# Patient Record
Sex: Male | Born: 1940
Health system: Southern US, Community
[De-identification: ages and names within clinical notes are randomized; demographics above are authoritative.]

## PROBLEM LIST (undated history)

## (undated) DIAGNOSIS — K635 Polyp of colon: Secondary | ICD-10-CM

## (undated) DIAGNOSIS — N400 Enlarged prostate without lower urinary tract symptoms: Secondary | ICD-10-CM

## (undated) DIAGNOSIS — D509 Iron deficiency anemia, unspecified: Secondary | ICD-10-CM

## (undated) HISTORY — PX: COLONOSCOPY: SHX174

## (undated) HISTORY — DX: Benign prostatic hyperplasia without lower urinary tract symptoms: N40.0

## (undated) HISTORY — PX: PROSTATE BIOPSY: SHX241

## (undated) HISTORY — DX: Iron deficiency anemia, unspecified: D50.9

## (undated) HISTORY — DX: Polyp of colon: K63.5

---

## 2002-01-21 ENCOUNTER — Encounter: Payer: Self-pay | Admitting: Internal Medicine

## 2002-01-21 ENCOUNTER — Encounter (INDEPENDENT_AMBULATORY_CARE_PROVIDER_SITE_OTHER): Payer: Self-pay | Admitting: *Deleted

## 2007-03-21 ENCOUNTER — Ambulatory Visit: Payer: Self-pay | Admitting: Internal Medicine

## 2007-04-04 ENCOUNTER — Ambulatory Visit: Payer: Self-pay | Admitting: Internal Medicine

## 2007-04-04 ENCOUNTER — Encounter: Payer: Self-pay | Admitting: Internal Medicine

## 2009-02-23 ENCOUNTER — Encounter (INDEPENDENT_AMBULATORY_CARE_PROVIDER_SITE_OTHER): Payer: Self-pay | Admitting: *Deleted

## 2009-03-30 ENCOUNTER — Ambulatory Visit: Payer: Self-pay | Admitting: Internal Medicine

## 2009-03-30 ENCOUNTER — Encounter (INDEPENDENT_AMBULATORY_CARE_PROVIDER_SITE_OTHER): Payer: Self-pay | Admitting: *Deleted

## 2009-03-30 DIAGNOSIS — Z860100 Personal history of colon polyps, unspecified: Secondary | ICD-10-CM | POA: Insufficient documentation

## 2009-03-30 DIAGNOSIS — K5520 Angiodysplasia of colon without hemorrhage: Secondary | ICD-10-CM | POA: Insufficient documentation

## 2009-03-30 DIAGNOSIS — D509 Iron deficiency anemia, unspecified: Secondary | ICD-10-CM

## 2009-04-20 ENCOUNTER — Encounter (INDEPENDENT_AMBULATORY_CARE_PROVIDER_SITE_OTHER): Payer: Self-pay | Admitting: *Deleted

## 2009-04-21 ENCOUNTER — Ambulatory Visit: Payer: Self-pay | Admitting: Internal Medicine

## 2009-05-05 ENCOUNTER — Ambulatory Visit: Payer: Self-pay | Admitting: Internal Medicine

## 2009-05-07 ENCOUNTER — Encounter: Payer: Self-pay | Admitting: Internal Medicine

## 2010-04-06 NOTE — Letter (Signed)
Summary: [facility] Instructions  [facility]  [address] [place], [place] [address]   Phone: [phone]  Fax: [phone]       TERENCE BART    [birth year]    MRN: [id number]        Procedure Day /Date:MONDAY, 04/06/09     Arrival Time:1:30 PM     Procedure Time:2:30 PM     Location of Procedure:                    [facility] (4th Floor)                        PREPARATION FOR COLONOSCOPY WITH MOVIPREP   Starting 5 days prior to your procedure 04/02/09 do not eat nuts, seeds, popcorn, corn, beans, peas,  salads, or any raw vegetables.  Do not take any fiber supplements (e.g. Metamucil, Citrucel, and Benefiber).  THE DAY BEFORE YOUR PROCEDURE         DATE:04/05/09  DAY: SUNDAY  1.  Drink clear liquids the entire day-NO SOLID FOOD  2.  Do not drink anything colored red or purple.  Avoid juices with pulp.  No orange juice.  3.  Drink at least 64 oz. (8 glasses) of fluid/clear liquids during the day to prevent dehydration and help the prep work efficiently.  CLEAR LIQUIDS INCLUDE: Water Jello Ice Popsicles Tea (sugar ok, no milk/cream) Powdered fruit flavored drinks Coffee (sugar ok, no milk/cream) Gatorade Juice: apple, white grape, white cranberry  Lemonade Clear bullion, consomm, broth Carbonated beverages (any kind) Strained chicken noodle soup Hard Candy                             4.  In the morning, mix first dose of MoviPrep solution:    Empty 1 Pouch A and 1 Pouch B into the disposable container    Add lukewarm drinking water to the top line of the container. Mix to dissolve    Refrigerate (mixed solution should be used within 24 hrs)  5.  Begin drinking the prep at 5:00 p.m. The MoviPrep container is divided by 4 marks.   Every 15 minutes drink the solution down to the next mark (approximately 8 oz) until the full liter is complete.   6.  Follow completed prep with 16 oz of clear liquid of your choice (Nothing red or  purple).  Continue to drink clear liquids until bedtime.  7.  Before going to bed, mix second dose of MoviPrep solution:    Empty 1 Pouch A and 1 Pouch B into the disposable container    Add lukewarm drinking water to the top line of the container. Mix to dissolve    Refrigerate  THE DAY OF YOUR PROCEDURE      DATE: 04/06/09 DAY: MONDAY  Beginning at9:30 a.m. (5 hours before procedure):         1. Every 15 minutes, drink the solution down to the next mark (approx 8 oz) until the full liter is complete.  2. Follow completed prep with 16 oz. of clear liquid of your choice.    3. You may drink clear liquids until 12:30 PM (2 HOURS BEFORE PROCEDURE).   MEDICATION INSTRUCTIONS  Unless otherwise instructed, you should take regular prescription medications with a small sip of water   as early as possible the morning of your procedure.   Additional medication instructions:  _ HOLD IRON STARTING 03/31/09         OTHER INSTRUCTIONS  You will need a responsible adult at least 70 years of age to accompany you and drive you home.   This person must remain in the waiting room during your procedure.  Wear loose fitting clothing that is easily removed.  Leave jewelry and other valuables at home.  However, you may wish to bring a book to read or  an iPod/MP3 player to listen to music as you wait for your procedure to start.  Remove all body piercing jewelry and leave at home.  Total time from sign-in until discharge is approximately 2-3 hours.  You should go home directly after your procedure and rest.  You can resume normal activities the  day after your procedure.  The day of your procedure you should not:   Drive   Make legal decisions   Operate machinery   Drink alcohol   Return to work  You will receive specific instructions about eating, activities and medications before you leave.    The above instructions have been reviewed and explained to me by    _______________________    I fully understand and can verbalize these instructions _____________________________ Date _________

## 2010-04-06 NOTE — Procedures (Signed)
Summary: Upper Endoscopy  Patient: [name] Note: All result statuses are Final unless otherwise noted.  Tests: (1) Upper Endoscopy (EGD)   EGD Upper Endoscopy       DONE     [facility]     [address]     [place], [place]  [address]           ENDOSCOPY PROCEDURE REPORT           PATIENT:  Chad Castillo, Chad Castillo  MR#:  [id number]     BIRTHDATE:  06-02-1940, 68 yrs. old  GENDER:  male           ENDOSCOPIST:  Wilhemina Bonito. Eda Keys, MD     Referred by:  Office           PROCEDURE DATE:  05/05/2009     PROCEDURE:  EGD with biopsy     ASA CLASS:  Class II     INDICATIONS:  iron deficiency anemia           MEDICATIONS:   There was residual sedation effect present from     prior procedure., Fentanyl 25 mcg IV, Versed 1 mg IV     TOPICAL ANESTHETIC:  Exactacain Spray           DESCRIPTION OF PROCEDURE:   After the risks benefits and     alternatives of the procedure were thoroughly explained, informed     consent was obtained.  The LB GIF-H180 [id number] endoscope was     introduced through the mouth and advanced to the second portion of     the duodenum, without limitations.  The instrument was slowly     withdrawn as the mucosa was fully examined.     <<PROCEDUREIMAGES>>           The upper, middle, and distal third of the esophagus were     carefully inspected and no abnormalities were noted. The z-line     was well seen at the GEJ. The endoscope was pushed into the fundus     which was normal including a retroflexed view. The antrum,gastric     body, first and second part of the duodenum were unremarkable. Bx     D2 taken to r/o sprue.   Retroflexed views revealed no     abnormalities.    The scope was then withdrawn from the patient     and the procedure completed.           COMPLICATIONS:  None           ENDOSCOPIC IMPRESSION:     1) Normal EGD     2) Suspect iron deficiency due to previously identified AVM's           RECOMMENDATIONS:     1) continue iron therapy  daily     2) Follow up biopsies     3) follow up blood counts with Dr [name] to be sure that iron is     working           ______________________________     Wilhemina Bonito. Eda Keys, MD           CC:  [name], MD, The Patient           n.     eSIGNED:   Wilhemina Bonito. Eda Keys at 05/05/2009 12:18 PM           Migues, Renae Fickle, [phone]  Note: An exclamation mark (!) indicates a result  that was not dispersed into the flowsheet. Document Creation Date: 05/05/2009 12:19 PM _______________________________________________________________________  (1) Order result status: Final Collection or observation date-time: 05/05/2009 12:10 Requested date-time:  Receipt date-time:  Reported date-time:  Referring Physician:   Ordering Physician: Fransico Setters [id number]) Specimen Source:  Source: Launa Grill Order Number: [id number] Lab site:

## 2010-04-06 NOTE — Miscellaneous (Signed)
Summary: [facility]  Clinical Lists Changes  Allergies: Added new allergy or adverse reaction of SULFA Observations: Added new observation of NKA: F (04/21/2009 15:38)

## 2010-04-06 NOTE — Procedures (Signed)
Summary: Colonoscopy   Colonoscopy  Procedure date:  01/21/2002  Findings:      Location:  [facility].  Results: Polyp.    Colonoscopy  Procedure date:  01/21/2002  Findings:      Location:  [facility].  Results: Polyp.   Patient Name: Chad, Castillo MRN:  Procedure Procedures: Colonoscopy CPT: [phone].    with polypectomy. CPT: [id number].  Personnel: Endoscopist: Wilhemina Bonito. Marina Goodell, MD.  Referred By: Pearletha Furl [name], MD.  Exam Location: Exam performed in Outpatient Clinic. Outpatient  Patient Consent: Procedure, Alternatives, Risks and Benefits discussed, consent obtained, from patient. Consent was obtained by the RN.  Indications  Average Risk Screening Routine.  History  Pre-Exam Physical: Performed Jan 21, 2002. Entire physical exam was normal.  Exam Exam: Extent of exam reached: Cecum, extent intended: Cecum.  The cecum was identified by appendiceal orifice and IC valve. Patient position: on left side. Colon retroflexion performed. Images taken. ASA Classification: I. Tolerance: excellent.  Monitoring: Pulse and BP monitoring, Oximetry used. Supplemental O2 given.  Colon Prep Used Golytely for colon prep. Prep results: excellent.  Sedation Meds: Patient assessed and found to be appropriate for moderate (conscious) sedation. Fentanyl 100 mcg. given IV. Versed 9 mg. given IV.  Findings MULTIPLE POLYPS: Ascending Colon. minimum size 1 mm, maximum size 4 mm. Procedure:  snare with cautery, removed, Polyp not retrieved, ICD9: Colon Polyps: 211.3.  NORMAL EXAM: Cecum to Rectum.  POLYP: Sigmoid Colon, Maximum size: 6 mm. pedunculated polyp. Procedure:  snare with cautery, removed, retrieved, sent to pathology. ICD9: Colon Polyps: 211.3.   Assessment Abnormal examination, see findings above.  Diagnoses: 211.3: Colon Polyps.   Events  Unplanned Interventions: No intervention was required.  Unplanned Events: There were no  complications. Plans Patient Education: Patient given standard instructions for: Polyps.  Disposition: After procedure patient sent to recovery. After recovery patient sent home.  Scheduling/Referral: Colonoscopy, to Wilhemina Bonito. Marina Goodell, MD, in 3 years if polyp adenomatous; otherwise 5 years.,    This report was created from the original endoscopy report, which was reviewed and signed by the above listed endoscopist.   cc:  [name], MD

## 2010-04-06 NOTE — Letter (Signed)
Summary: [facility] Instructions  [facility]  [address] [place], [place] [address]   Phone: [phone]  Fax: [phone]       Chad Castillo    30-Apr-1940    MRN: [id number]        Procedure Day /Date:  Tuesday 05/05/2009     Arrival Time: 10:00 am      Procedure Time: 11:00 am     Location of Procedure:                    _x _  [facility] (4th Floor)   PREPARATION FOR COLONOSCOPY WITH MOVIPREP   Starting 5 days prior to your procedure Thursday 2/24 do not eat nuts, seeds, popcorn, corn, beans, peas,  salads, or any raw vegetables.  Do not take any fiber supplements (e.g. Metamucil, Citrucel, and Benefiber).  THE DAY BEFORE YOUR PROCEDURE         DATE: Monday 2/28  1.  Drink clear liquids the entire day-NO SOLID FOOD  2.  Do not drink anything colored red or purple.  Avoid juices with pulp.  No orange juice.  3.  Drink at least 64 oz. (8 glasses) of fluid/clear liquids during the day to prevent dehydration and help the prep work efficiently.  CLEAR LIQUIDS INCLUDE: Water Jello Ice Popsicles Tea (sugar ok, no milk/cream) Powdered fruit flavored drinks Coffee (sugar ok, no milk/cream) Gatorade Juice: apple, white grape, white cranberry  Lemonade Clear bullion, consomm, broth Carbonated beverages (any kind) Strained chicken noodle soup Hard Candy                             4.  In the morning, mix first dose of MoviPrep solution:    Empty 1 Pouch A and 1 Pouch B into the disposable container    Add lukewarm drinking water to the top line of the container. Mix to dissolve    Refrigerate (mixed solution should be used within 24 hrs)  5.  Begin drinking the prep at 5:00 p.m. The MoviPrep container is divided by 4 marks.   Every 15 minutes drink the solution down to the next mark (approximately 8 oz) until the full liter is complete.   6.  Follow completed prep with 16 oz of clear liquid of your choice (Nothing red or purple).   Continue to drink clear liquids until bedtime.  7.  Before going to bed, mix second dose of MoviPrep solution:    Empty 1 Pouch A and 1 Pouch B into the disposable container    Add lukewarm drinking water to the top line of the container. Mix to dissolve    Refrigerate  THE DAY OF YOUR PROCEDURE      DATE: Tuesday 3/1  Beginning at 6:00 a.m. (5 hours before procedure):         1. Every 15 minutes, drink the solution down to the next mark (approx 8 oz) until the full liter is complete.  2. Follow completed prep with 16 oz. of clear liquid of your choice.    3. You may drink clear liquids until 9:00 am (2 HOURS BEFORE PROCEDURE).   MEDICATION INSTRUCTIONS  Unless otherwise instructed, you should take regular prescription medications with a small sip of water   as early as possible the morning of your procedure.  Diabetic patients - see separate instructions.  Stop taking Plavix or Aggrenox on  _  _  (  7 days before procedure).     Stop taking Coumadin on  _ _  (5 days before procedure).  Additional medication instructions:  Hod Iron 7 days before procedure._         OTHER INSTRUCTIONS  You will need a responsible adult at least 70 years of age to accompany you and drive you home.   This person must remain in the waiting room during your procedure.  Wear loose fitting clothing that is easily removed.  Leave jewelry and other valuables at home.  However, you may wish to bring a book to read or  an iPod/MP3 player to listen to music as you wait for your procedure to start.  Remove all body piercing jewelry and leave at home.  Total time from sign-in until discharge is approximately 2-3 hours.  You should go home directly after your procedure and rest.  You can resume normal activities the  day after your procedure.  The day of your procedure you should not:   Drive   Make legal decisions   Operate machinery   Drink alcohol   Return to work  You will  receive specific instructions about eating, activities and medications before you leave.    The above instructions have been reviewed and explained to me by   Ezra Sites RN  April 21, 2009 3:57 PM    I fully understand and can verbalize these instructions _____________________________ Date _________

## 2010-04-06 NOTE — Letter (Signed)
Summary: Previsit letter  [facility]  [address] [place], [place] [address]   Phone: [phone]  Fax: [phone]       03/30/2009 MRN: [id number]  Chad Castillo [address] [place], [place]  [address]  Dear Mr. Menter,  Welcome to the Gastroenterology Division at [facility].    You are scheduled to see a nurse for your pre-procedure visit on 04-21-09 at 3:30p.m. on the 3rd floor at [facility], [address]  We ask that you try to arrive at our office 15 minutes prior to your appointment time to allow for check-in.  Your nurse visit will consist of discussing your medical and surgical history, your immediate family medical history, and your medications.    Please bring a complete list of all your medications or, if you prefer, bring the medication bottles and we will list them.  We will need to be aware of both prescribed and over the counter drugs.  We will need to know exact dosage information as well.  If you are on blood thinners (Coumadin, Plavix, Aggrenox, Ticlid, etc.) please call our office today/prior to your appointment, as we need to consult with your physician about holding your medication.   Please be prepared to read and sign documents such as consent forms, a financial agreement, and acknowledgement forms.  If necessary, and with your consent, a friend or relative is welcome to sit-in on the nurse visit with you.  Please bring your insurance card so that we may make a copy of it.  If your insurance requires a referral to see a specialist, please bring your referral form from your primary care physician.  No co-pay is required for this nurse visit.     If you cannot keep your appointment, please call [phone] to cancel or reschedule prior to your appointment date.  This allows [place] the opportunity to schedule an appointment for another patient in need of care.    Thank you for choosing [facility] for your medical needs.  We  appreciate the opportunity to care for you.  Please visit [place] at our website  to learn more about our practice.                     Sincerely.                                                                                                                   The Gastroenterology Division

## 2010-04-06 NOTE — Assessment & Plan Note (Signed)
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 He is sent today for newly diagnosed iron deficiency anemia. Patient reports developing exertional fatigue and shortness of breath while playing racquetball this past fall. He saw Dr. [name] for his routine annual evaluation in December. At that time CBC revealed anemia with hemoglobin of 8.5. Significant microcytosis with an MCV of 69.4. Comprehensive metabolic panel was normal. Hemoccult testing was negative. The patient had no prior history of anemia. His GI review of systems is entirely negative. In particular, no heartburn, dysphagia, abdominal pain, weight loss, melena, or hematochezia. He initially underwent colonoscopy in 2003 and was found to have diminutive colon polyps. Followup colonoscopy in 2009 again revealed diminutive polyps which were adenomatous. The patient was also noted to have some nonbleeding AVMs in the cecum. He has been placed on iron therapy over the past month and is feeling somewhat better. He denies being her recent blood donor were using significant NSAIDs. No family history of celiac disease or anemia. No family history of gastrointestinal malignancy.   GI Review of Systems      Denies abdominal pain, acid reflux, belching, bloating, chest pain, dysphagia with liquids, dysphagia with solids, heartburn, loss of appetite, nausea, vomiting, vomiting blood, weight loss, and  weight gain.        Denies anal fissure, black tarry stools, change in bowel habit, constipation, diarrhea, diverticulosis, fecal incontinence, heme positive stool, hemorrhoids, irritable bowel syndrome, jaundice, light color  stool, liver problems, rectal bleeding, and  rectal pain.    Current Medications (verified): 1)  Centrum Silver  Tabs (Multiple Vitamins-Minerals) .Marland Kitchen.. 1 Tablet By Mouth Once Daily 2)  Fish Oil 1200 Mg Caps (Omega-3 Fatty Acids) .... Take 1 Capsule By Mouth Two Times A Day 3)  Propranolol Hcl 10 Mg Tabs (Propranolol Hcl) .... Take As Needed 4)  Viagra 50 Mg Tabs (Sildenafil Citrate) .... Take As Needed 5)  Vitamin D3 2000 Unit Caps (Cholecalciferol) .... Take 1 Capsule By Mouth Once A Day 6)  Poly-Iron 150 150 Mg Caps (Polysaccharide Iron Complex) .... Take 1 Capsule By Mouth Two Times A Day  Allergies (verified): No Known Drug Allergies  Past History:  Past Medical History: Reviewed history from 03/26/2009 and no changes required. Adenomatous Colon Polyps BPH Iron Deficiency-Anemia  Past Surgical History: Reviewed history from 03/26/2009 and no changes required. 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 Quit 1981 Alcohol Use - yes 1-2 glasses wine/day Daily Caffeine Use 2-3 cups/coffee Illicit Drug Use - no  Review of Systems       exertional shortness of breath. Otherwise negative non-GI review of systems  Vital Signs:  Patient profile:   70 year old male Height:      69 inches Weight:      154 pounds BMI:     22.82 Pulse rate:   72 / minute Pulse rhythm:   regular BP sitting:   128 / 66  (right arm) Cuff size:   regular  Vitals Entered By: Francee Piccolo CMA Duncan Dull) (March 30, 2009 1:50 PM)  Physical Exam  General:  Well developed,  well nourished, no acute distress. Head:  Normocephalic and atraumatic. Eyes:  PERRLA, no icterus.conjunctiva are pink Nose:  No deformity, discharge,  or lesions. Mouth:  No deformity or lesions, dentition normal. Lungs:  Clear throughout to auscultation. Heart:  Regular rate and rhythm; no  murmurs, rubs,  or bruits. Abdomen:  Soft, nontender and nondistended. No masses, hepatosplenomegaly or hernias noted. Normal bowel sounds. Rectal:  deferred until colonoscopy Msk:  Symmetrical with no gross deformities. Normal posture. Pulses:  Normal pulses noted. Extremities:  No clubbing, cyanosis, edema or deformities noted. Neurologic:  Alert and  oriented x4;  grossly normal neurologically. Skin:  Intact without significant lesions or rashes. Psych:  Alert and cooperative. Normal mood and affect.   Impression & Recommendations:  Problem # 1:  ANEMIA, IRON DEFICIENCY (ICD-97.5) 70 year old male who presents with new onset iron deficiency anemia. Negative GI review of systems and Hemoccult-negative stool. I suspect that he has had chronic blood loss on the basis of previously noted AVMs. Does have a history of adenomatous polyps on previous colonoscopy. Need to exclude other possible GI mucosal abnormalities. No prior history of EGD.  Plan: #1. Colonoscopy and upper endoscopy (with possible duodenal biopsies). The nature of the procedures as well as the risks, benefits, and alternatives have been reviewed. He understood and agreed to proceed. #2. Movi prep prescribed. The patient instructed on its use #3. Continue iron #4. Outpatient monitoring of blood counts  Problem # 2:  COLONIC POLYPS, HX OF (ICD-V12.72) history of adenomatous colon polyps. Initially due for surveillance in 2014. We will move his exam this date given the new onset iron deficiency anemia and a prior history of adenomatous polyps.  Problem # 3:  ANGIODYSPLASIA-INTESTINE (UUV-253.66) Assessment: Deteriorated this was noted incidentally on colonoscopy. This is the likely explanation for iron deficiency. We'll anyway further possible causes as this problem is new. Workup as outlined above. If no other pathology found, then angiodysplasia would explain iron deficiency and the treatment would be indefinite therapy with  periodic monitoring of blood counts with Dr. [name] .  Patient Instructions: 1)  Pt. wants to call and schedule Endo/Colon when he can check his schedule. 2)  Office number given to patient. 3)  Advised patient he would have to come in for Nurse Pre-visit prior to his procedure date. 4)  Pt. is to hold Iron x 7 days prior to procedure. 5)  The medication list was reviewed and reconciled.  All changed / newly prescribed medications were explained.  A complete medication list was provided to the patient / caregiver. 6)  Copy: Dr. [name] Prescriptions: MOVIPREP 100 GM  SOLR (PEG-KCL-NACL-NASULF-NA ASC-C) As per prep instructions.  #1 x 0   Entered by:   Milford Cage NCMA   Authorized by:   Hilarie Fredrickson MD   Signed by:   Milford Cage NCMA on 03/30/2009   Method used:   Electronically to        [facility] (retail)       [address]       [place], [place]  [id number]       Ph: [phone]       Fax: [phone]   RxID:   [id number]

## 2010-04-06 NOTE — Letter (Signed)
Summary: Patient Notice- Polyp Results  [facility]  [address] [place], [place] [address]   Phone: [phone]  Fax: [phone]        May 07, 2009 MRN: [id number]    Chad Castillo [address] [place], [place]  [address]    Dear [name],  I am pleased to inform you that the colon polyp(s) removed during your recent colonoscopy was (were) found to be benign (no cancer detected) upon pathologic examination.  I recommend you have a repeat colonoscopy examination in 5 years to look for recurrent polyps, as having colon polyps increases your risk for having recurrent polyps or even colon cancer in the future.  ALSO, THE DUODENAL BIOPSIES WERE NORMAL. NO EVIDENCE OF IRON ABSORPTION PROBLEM.  Should you develop new or worsening symptoms of abdominal pain, bowel habit changes or bleeding from the rectum or bowels, please schedule an evaluation with either your primary care physician or with me.  Additional information/recommendations:  __ No further action with gastroenterology is needed at this time. Please      follow-up with your primary care physician for your other healthcare      needs.  __ Continue treatment plan as outlined the day of your exam.    Please call us if you are having persistent problems or have questions about your condition that have not been fully answered at this time.  Sincerely,  Hilarie Fredrickson MD  This letter has been electronically signed by your physician.  Appended Document: Patient Notice- Polyp Results letter mailed 3.7.11

## 2010-04-06 NOTE — Procedures (Signed)
Summary: Colonoscopy  Patient: Chad Castillo Note: All result statuses are Final unless otherwise noted.  Tests: (1) Colonoscopy (COL)   COL Colonoscopy           DONE     [facility]     [address]     [place], [place]  [address]           COLONOSCOPY PROCEDURE REPORT           PATIENT:  Chad Castillo, Chad Castillo  MR#:  [id number]     BIRTHDATE:  [birth year], 68 yrs. old  GENDER:  male           ENDOSCOPIST:  Wilhemina Bonito. Eda Keys, MD     Referred by:  Office           PROCEDURE DATE:  05/05/2009     PROCEDURE:  Colonoscopy with snare polypectomy     ASA CLASS:  Class II     INDICATIONS:  New iron deficiency anemia ; (colonoscopy in 2003     and 2009 w/ adenomas)           MEDICATIONS:   Fentanyl 75 mcg IV, Versed 9 mg IV           DESCRIPTION OF PROCEDURE:   After the risks benefits and     alternatives of the procedure were thoroughly explained, informed     consent was obtained.  Digital rectal exam was performed and     revealed no abnormalities.   The LB [id number] endoscope     was introduced through the anus and advanced to the cecum, which     was identified by both the appendix and ileocecal valve, without     limitations.Time to cecum = 3:48 min.  The quality of the prep was     excellent, using MoviPrep.  The instrument was then slowly     withdrawn (time = 15:48 min) as the colon was fully examined.     <<PROCEDUREIMAGES>>           FINDINGS:  A diminutive polyp was found in the ascending colon.     Polyp was snared without cautery. Retrieval was successful. snare     polyp  This was otherwise a normal examination of the colon.  The     terminal ileum appeared normal.   Retroflexed views in the rectum     revealed no abnormalities.    The scope was then withdrawn from     the patient and the procedure completed.           COMPLICATIONS:  None           ENDOSCOPIC IMPRESSION:     1) Diminutive polyp in the ascending colon     2) Otherwise normal  examination     3) Normal terminal ileum     4) No AVM's seen today           RECOMMENDATIONS:     1) Follow up colonoscopy in 5 years     2) EGD today (see that report for additional recommendations)           ______________________________     Wilhemina Bonito. Eda Keys, MD           CC:  [name], MD; The Patient           n.     eSIGNED:   Wilhemina Bonito. Eda Keys at 05/05/2009 12:04 PM  [place], [place], [phone]  Note: An exclamation mark (!) indicates a result that was not dispersed into the flowsheet. Document Creation Date: 05/05/2009 12:05 PM _______________________________________________________________________  (1) Order result status: Final Collection or observation date-time: 05/05/2009 11:57 Requested date-time:  Receipt date-time:  Reported date-time:  Referring Physician:   Ordering Physician: Fransico Setters [id number]) Specimen Source:  Source: Launa Grill Order Number: [id number] Lab site:   Appended Document: Colonoscopy recall 5 yrs/05-2014     Procedures Next Due Date:    Colonoscopy: 05/2014

## 2010-04-06 NOTE — Procedures (Signed)
Summary: Colonoscopy: Adenomatous Polyp   Colonoscopy  Procedure date:  04/04/2007  Findings:      Pathology:  Adenomatous polyp.        Location:  [facility].    Comments:      Repeat colonoscopy in 5 years.   Procedures Next Due Date:    Colonoscopy: 04/2012 Patient Name: Chad Castillo, Chad Castillo MRN:  Procedure Procedures: Colonoscopy CPT: [address].    with polypectomy. CPT: A3573898.  Personnel: Endoscopist: Wilhemina Bonito. Marina Goodell, MD.  Exam Location: Exam performed in Outpatient Clinic. Outpatient  Patient Consent: Procedure, Alternatives, Risks and Benefits discussed, consent obtained, from patient. Consent was obtained by the RN.  Indications  Surveillance of: Adenomatous Polyp(s). This is an initial surveillance exam. Initial polypectomy was performed in 2003. in Nov. 3 or more Polyps were found at Index Exam. Largest polyp removed was 6 to 9 mm. Prior polyp located in both proximal and distal colon. Pathology of worst  polyp: unknown.  History  Current Medications: Patient is not currently taking Coumadin.  Pre-Exam Physical: Performed Apr 04, 2007. Cardio-pulmonary exam, Rectal exam, HEENT exam , Abdominal exam, Mental status exam WNL.  Comments: Pt. history reviewed/updated, physical exam performed prior to initiation of sedation?YES Exam Exam: Extent of exam reached: Cecum, extent intended: Cecum.  The cecum was identified by appendiceal orifice and IC valve. Patient position: on left side. Time to Cecum: 00:06:17. Time for Withdrawl: 00:17:28. Colon retroflexion performed. Images taken. ASA Classification: I. Tolerance: excellent.  Monitoring: Pulse and BP monitoring, Oximetry used. Supplemental O2 given.  Colon Prep Used MIRALAX for colon prep. Prep results: good.  Sedation Meds: Patient assessed and found to be appropriate for moderate (conscious) sedation. Fentanyl 75 mcg. given IV. Versed 8 mg. given IV.  Findings POLYP: Transverse Colon, Maximum  size: 3 mm. sessile polyp. Procedure:  snare without cautery, removed, retrieved, Polyp sent to pathology. ICD9: Colon Polyps: 211.3.  NORMAL EXAM: Rectum to Cecum. Comments: Landmarks.  ANGIODYSPLASIA (AVMs): 1 AVMs, maximum size 3 mm, non-bleeding in Cecum. ICD9: Angiodysplasia without Hemorrhage: 569.84.  POLYP: Descending Colon, Maximum size: 5 mm. sessile polyp. Procedure:  snare without cautery, removed, retrieved, sent to pathology. ICD9: Colon Polyps: 211.3.   Assessment  Diagnoses: 211.3: Colon Polyps. x 2.  604.54: Angiodysplasia without Hemorrhage.   Events  Unplanned Interventions: No intervention was required.  Unplanned Events: There were no complications. Plans Disposition: After procedure patient sent to recovery. After recovery patient sent home.  Scheduling/Referral: Colonoscopy, to Wilhemina Bonito. Marina Goodell, MD, in 5 years,    This report was created from the original endoscopy report, which was reviewed and signed by the above listed endoscopist.   cc:  [name], MD

## 2012-04-12 ENCOUNTER — Encounter: Payer: Self-pay | Admitting: Internal Medicine

## 2012-04-12 ENCOUNTER — Ambulatory Visit (INDEPENDENT_AMBULATORY_CARE_PROVIDER_SITE_OTHER): Payer: 59 | Admitting: Internal Medicine

## 2012-04-12 VITALS — BP 144/80 | HR 64 | Ht 69.0 in | Wt 148.6 lb

## 2012-04-12 DIAGNOSIS — K5520 Angiodysplasia of colon without hemorrhage: Secondary | ICD-10-CM

## 2012-04-12 DIAGNOSIS — Z860100 Personal history of colon polyps, unspecified: Secondary | ICD-10-CM

## 2012-04-12 DIAGNOSIS — R195 Other fecal abnormalities: Secondary | ICD-10-CM

## 2012-04-12 DIAGNOSIS — Q2733 Arteriovenous malformation of digestive system vessel: Secondary | ICD-10-CM

## 2012-04-12 NOTE — Patient Instructions (Addendum)
Please call us when you are ready to schedule your colonoscopy

## 2012-04-12 NOTE — Progress Notes (Signed)
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 [name] regarding Hemoccult-positive stool. The patient was last evaluated in the office January 2011 regarding iron deficiency anemia. Hemoccult negative stools at that time. He has had previous colonoscopies in 2003, 2009 (nonbleeding AVM in the cecum noted), and most recently March 2011 to evaluate the iron deficiency anemia. That examination revealed a diminutive descending colon polyp, normal terminal ileum, but otherwise normal without AVMs noted. The polyp was a tubular adenoma in routine followup in 5 years recommended. The patient also underwent upper endoscopy at that time. This was entirely normal. He was suspected as having AVMs are the cause for his iron deficiency anemia. He was put on iron replacement therapy with marked improvement in his hemoglobin level. He has subsequently gone off iron and has maintained a normal hemoglobin. As part of his routine physical examination he submitted Hemoccult cards last month. This returned positive. Hemoglobin at that time was normal at 15.8 with MCV of 99.9. Other laboratories unremarkable. The patient's GI review of systems is entirely negative. No family history of colon cancer.  REVIEW OF SYSTEMS:  All non-GI ROS negative entirely upon review   Past Medical History  Diagnosis Date  . BPH (benign prostatic hyperplasia)   . Iron deficiency anemia   . Colon polyps     adenomatous    Past Surgical History  Procedure Date  . Prostate biopsy     Social History Chad Castillo  reports that he quit smoking about 30 years ago. His smoking use included Cigarettes. He has a 25 pack-year smoking history. He has never used smokeless tobacco. He reports that he drinks alcohol. He reports that he does not use illicit drugs.  family history includes Heart disease in his mother  and Hypertension in his mother.  Allergies  Allergen Reactions  . Sulfonamide Derivatives     REACTION: as child, unspecified       PHYSICAL EXAMINATION:  Vital signs: BP 144/80  Pulse 64  Ht 5\' 9"  (1.753 m)  Wt 148 lb 9.6 oz (67.405 kg)  BMI 21.94 kg/m2 General: Well-developed, well-nourished, no acute distress HEENT: Sclerae are anicteric, conjunctiva pink. Oral mucosa intact Lungs: Clear Heart: Regular Abdomen: soft, nontender, nondistended, no obvious ascites, no peritoneal signs, normal bowel sounds. No organomegaly. Extremities: No edema Psychiatric: alert and oriented x3. Cooperative     ASSESSMENT:  #1. Hemoccult-positive stool. Normal hemoglobin. Negative GI review of systems. Likely due to occult AVM #2. History of adenomatous colon polyps. Prior colonoscopies 2003, 2009, and 2011 as outlined #3. Nonbleeding cecal AVMs noted on one colonoscopy #4. History of iron deficiency anemia   PLAN:  #1. We discussed the options in terms of evaluating the positive stool. My index of suspicion of any serious pathology other than AVMs is quite low. However, he does have a history of adenomatous polyps and interval neoplasia cannot be entirely excluded. To this end, he has decided to proceed with colonoscopy as offered. He would like to have this set up after completing the semester. He will contact the office to set up the exam with our previsit nurse. In the future, we discussed that he, given his personal history of AVMs, should not undergo routine Hemoccult testing within the first 5 years after colonoscopy, as the chance for a positive result (not related to neoplasia) is high and may  lead to premature GI evaluations. I also suggested that he may want to take iron daily to reduce the risk of recurrent iron deficiency anemia.

## 2012-12-18 ENCOUNTER — Telehealth: Payer: Self-pay | Admitting: Internal Medicine

## 2012-12-18 DIAGNOSIS — R195 Other fecal abnormalities: Secondary | ICD-10-CM

## 2012-12-18 NOTE — Telephone Encounter (Signed)
Pt scheduled for previsit 12/26/12@3 :30pm, Colon scheduled for 01/24/13@2pm . Pt aware of appts.

## 2012-12-21 ENCOUNTER — Encounter: Payer: Self-pay | Admitting: Internal Medicine

## 2012-12-26 ENCOUNTER — Ambulatory Visit (AMBULATORY_SURGERY_CENTER): Payer: Self-pay | Admitting: *Deleted

## 2012-12-26 VITALS — Ht 69.0 in | Wt 152.4 lb

## 2012-12-26 DIAGNOSIS — R195 Other fecal abnormalities: Secondary | ICD-10-CM

## 2012-12-26 MED ORDER — MOVIPREP 100 G PO SOLR: 1.0000 | Freq: Once | ORAL | Status: DC

## 2012-12-26 NOTE — Progress Notes (Signed)
No egg or soy allergy. No anesthesia problems.  

## 2013-01-03 ENCOUNTER — Encounter: Payer: Self-pay | Admitting: Internal Medicine

## 2013-01-24 ENCOUNTER — Encounter: Payer: Self-pay | Admitting: Internal Medicine

## 2013-01-24 ENCOUNTER — Ambulatory Visit (AMBULATORY_SURGERY_CENTER): Payer: Medicare Other | Admitting: Internal Medicine

## 2013-01-24 VITALS — BP 128/71 | HR 55 | Temp 96.2°F | Resp 20 | Ht 69.0 in | Wt 152.0 lb

## 2013-01-24 DIAGNOSIS — Z86010 Personal history of colonic polyps: Secondary | ICD-10-CM

## 2013-01-24 DIAGNOSIS — R195 Other fecal abnormalities: Secondary | ICD-10-CM

## 2013-01-24 MED ORDER — SODIUM CHLORIDE 0.9 % IV SOLN: 500.0000 mL | INTRAVENOUS | Status: DC

## 2013-01-24 NOTE — Patient Instructions (Signed)
YOU HAD AN ENDOSCOPIC PROCEDURE TODAY AT [facility]: Refer to the procedure report that was given to you for any specific questions about what was found during the examination.  If the procedure report does not answer your questions, please call your gastroenterologist to clarify.  If you requested that your care partner not be given the details of your procedure findings, then the procedure report has been included in a sealed envelope for you to review at your convenience later.  YOU SHOULD EXPECT: Some feelings of bloating in the abdomen. Passage of more gas than usual.  Walking can help get rid of the air that was put into your GI tract during the procedure and reduce the bloating. If you had a lower endoscopy (such as a colonoscopy or flexible sigmoidoscopy) you may notice spotting of blood in your stool or on the toilet paper. If you underwent a bowel prep for your procedure, then you may not have a normal bowel movement for a few days.  DIET: Your first meal following the procedure should be a light meal and then it is ok to progress to your normal diet.  A half-sandwich or bowl of soup is an example of a good first meal.  Heavy or fried foods are harder to digest and may make you feel nauseous or bloated.  Likewise meals heavy in dairy and vegetables can cause extra gas to form and this can also increase the bloating.  Drink plenty of fluids but you should avoid alcoholic beverages for 24 hours.  ACTIVITY: Your care partner should take you home directly after the procedure.  You should plan to take it easy, moving slowly for the rest of the day.  You can resume normal activity the day after the procedure however you should NOT DRIVE or use heavy machinery for 24 hours (because of the sedation medicines used during the test).    SYMPTOMS TO REPORT IMMEDIATELY: A gastroenterologist can be reached at any hour.  During normal business hours, 8:30 AM to 5:00 PM Monday through Friday,  call [phone].  After hours and on weekends, please call the GI answering service at [phone] who will take a message and have the physician on call contact you.   Following lower endoscopy (colonoscopy or flexible sigmoidoscopy):  Excessive amounts of blood in the stool  Significant tenderness or worsening of abdominal pains  Swelling of the abdomen that is new, acute  Fever of 100F or higher F  FOLLOW UP: If any biopsies were taken you will be contacted by phone or by letter within the next 1-3 weeks.  Call your gastroenterologist if you have not heard about the biopsies in 3 weeks.  Our staff will call the home number listed on your records the next business day following your procedure to check on you and address any questions or concerns that you may have at that time regarding the information given to you following your procedure. This is a courtesy call and so if there is no answer at the home number and we have not heard from you through the emergency physician on call, we will assume that you have returned to your regular daily activities without incident.  SIGNATURES/CONFIDENTIALITY: You and/or your care partner have signed paperwork which will be entered into your electronic medical record.  These signatures attest to the fact that that the information above on your After Visit Summary has been reviewed and is understood.  Full responsibility of the confidentiality of  this discharge information lies with you and/or your care-partner.  Normal colonoscopy.

## 2013-01-24 NOTE — Op Note (Signed)
[facility] [address] [place], [address]   COLONOSCOPY PROCEDURE REPORT  PATIENT: Chad Castillo, Chad Castillo  MR#: [id number] BIRTHDATE: October 16, 1940 , 72  yrs. old GENDER: Male ENDOSCOPIST: Roxy Cedar, MD REFERRED [id number] [name], M.D. PROCEDURE DATE:  01/24/2013 PROCEDURE:   Colonoscopy, diagnostic First Screening Colonoscopy - Avg.  risk and is 50 yrs.  old or older - No.  Prior Negative Screening - Now for repeat screening. N/A  History of Adenoma - Now for follow-up colonoscopy & has been > or = to 3 yrs.  N/A  Polyps Removed Today? No.  Recommend repeat exam, <10 yrs? No. ASA CLASS:   Class II INDICATIONS:heme-positive stool and Patient's personal history of adenomatous colon polyps - 2003, 2009 (AVM), [address] (TA). MEDICATIONS: MAC sedation, administered by CRNA and propofol (Diprivan) 350mg  IV  DESCRIPTION OF PROCEDURE:   After the risks benefits and alternatives of the procedure were thoroughly explained, informed consent was obtained.  A digital rectal exam revealed no abnormalities of the rectum.   The LB [id number]  endoscope was introduced through the anus and advanced to the cecum, which was identified by both the appendix and ileocecal valve. No adverse events experienced.   The quality of the prep was good, using MoviPrep  The instrument was then slowly withdrawn as the colon was fully examined.      COLON FINDINGS: A normal appearing cecum, ileocecal valve, and appendiceal orifice were identified.  The ascending, hepatic flexure, transverse, splenic flexure, descending, sigmoid colon and rectum appeared unremarkable.  No polyps or cancers were seen. Retroflexed views revealed no abnormalities. The time to cecum=4 minutes 56 seconds.  Withdrawal time=12 minutes 06 seconds.  The scope was withdrawn and the procedure completed.  COMPLICATIONS: There were no complications.  ENDOSCOPIC IMPRESSION: 1. Normal colon  RECOMMENDATIONS: 1.  Return to the care of your primary provider.  GI follow up as needed 2. I do not recommend routine annual hemoccult studies for this patient   eSigned:  Roxy Cedar, MD 01/24/2013 2:28 PM   cc: [name], MD and The Patient

## 2013-01-24 NOTE — Progress Notes (Signed)
Lidocaine-40mg IV prior to Propofol InductionPropofol given over incremental dosages 

## 2013-01-24 NOTE — Progress Notes (Signed)
Patient did not experience any of the following events: a burn prior to discharge; a fall within the facility; wrong site/side/patient/procedure/implant event; or a hospital transfer or hospital admission upon discharge from the facility. (G8907) Patient did not have preoperative order for IV antibiotic SSI prophylaxis. (G8918)  

## 2013-01-25 ENCOUNTER — Telehealth: Payer: Self-pay | Admitting: *Deleted

## 2013-01-25 NOTE — Telephone Encounter (Signed)
  Follow up Call-  Call back number 01/24/2013  Post procedure Call Back phone  # [phone]  Permission to leave phone message Yes     Patient questions:  Do you have a fever, pain , or abdominal swelling? no Pain Score  0 *  Have you tolerated food without any problems? yes  Have you been able to return to your normal activities? yes  Do you have any questions about your discharge instructions: Diet   no Medications  no Follow up visit  no  Do you have questions or concerns about your Care? no  Actions: * If pain score is 4 or above: No action needed, pain <4.

## 2014-05-12 ENCOUNTER — Encounter: Payer: Self-pay | Admitting: Internal Medicine

## 2015-04-09 ENCOUNTER — Encounter: Payer: Self-pay | Admitting: Internal Medicine

## 2019-03-19 ENCOUNTER — Ambulatory Visit: Payer: Medicare Other | Attending: Internal Medicine

## 2019-03-19 DIAGNOSIS — Z23 Encounter for immunization: Secondary | ICD-10-CM | POA: Insufficient documentation

## 2019-03-19 NOTE — Progress Notes (Signed)
   Covid-19 Vaccination Clinic  Name:  Chad Castillo    MRN: [id number] DOB: 08/20/1940  03/19/2019  [name] was observed post Covid-19 immunization for 15 minutes without incidence. He was provided with Vaccine Information Sheet and instruction to access the V-Safe system.   [name] was instructed to call 911 with any severe reactions post vaccine: Marland Kitchen Difficulty breathing  . Swelling of your face and throat  . A fast heartbeat  . A bad rash all over your body  . Dizziness and weakness    Immunizations Administered    Name Date Dose VIS Date Route   Pfizer COVID-19 Vaccine 03/19/2019  9:17 AM 0.3 mL 02/15/2019 Intramuscular   Manufacturer: [employer], [employer]   Lot: [id number]   NDC: [address]-3

## 2019-04-05 ENCOUNTER — Ambulatory Visit: Payer: Medicare PPO

## 2019-04-06 ENCOUNTER — Ambulatory Visit: Payer: Medicare PPO | Attending: Internal Medicine

## 2019-04-06 DIAGNOSIS — Z23 Encounter for immunization: Secondary | ICD-10-CM

## 2019-04-06 NOTE — Progress Notes (Signed)
   Covid-19 Vaccination Clinic  Name:  Chad Castillo    MRN: [id number] DOB: 27-Sep-1940  04/06/2019  Mr. [name] was observed post Covid-19 immunization for 15 minutes without incidence. He was provided with Vaccine Information Sheet and instruction to access the V-Safe system.   Mr. [name] was instructed to call 911 with any severe reactions post vaccine: Marland Kitchen Difficulty breathing  . Swelling of your face and throat  . A fast heartbeat  . A bad rash all over your body  . Dizziness and weakness    Immunizations Administered    Name Date Dose VIS Date Route   Pfizer COVID-19 Vaccine 04/06/2019  9:13 AM 0.3 mL 02/15/2019 Intramuscular   Manufacturer: [employer], [employer]   Lot: [id number]   NDC: [address]-8

## 2019-09-17 DIAGNOSIS — Z0000 Encounter for general adult medical examination without abnormal findings: Secondary | ICD-10-CM | POA: Diagnosis not present

## 2019-09-17 DIAGNOSIS — Z125 Encounter for screening for malignant neoplasm of prostate: Secondary | ICD-10-CM | POA: Diagnosis not present

## 2019-09-17 DIAGNOSIS — R7989 Other specified abnormal findings of blood chemistry: Secondary | ICD-10-CM | POA: Diagnosis not present

## 2019-09-23 DIAGNOSIS — Z0000 Encounter for general adult medical examination without abnormal findings: Secondary | ICD-10-CM | POA: Diagnosis not present

## 2019-09-23 DIAGNOSIS — H9190 Unspecified hearing loss, unspecified ear: Secondary | ICD-10-CM | POA: Diagnosis not present

## 2019-09-23 DIAGNOSIS — N529 Male erectile dysfunction, unspecified: Secondary | ICD-10-CM | POA: Diagnosis not present

## 2019-09-23 DIAGNOSIS — R82998 Other abnormal findings in urine: Secondary | ICD-10-CM | POA: Diagnosis not present

## 2019-09-23 DIAGNOSIS — M720 Palmar fascial fibromatosis [Dupuytren]: Secondary | ICD-10-CM | POA: Diagnosis not present

## 2019-09-23 DIAGNOSIS — Z87438 Personal history of other diseases of male genital organs: Secondary | ICD-10-CM | POA: Diagnosis not present

## 2019-11-13 DIAGNOSIS — H43813 Vitreous degeneration, bilateral: Secondary | ICD-10-CM | POA: Diagnosis not present

## 2019-11-13 DIAGNOSIS — H353121 Nonexudative age-related macular degeneration, left eye, early dry stage: Secondary | ICD-10-CM | POA: Diagnosis not present

## 2019-11-13 DIAGNOSIS — H25813 Combined forms of age-related cataract, bilateral: Secondary | ICD-10-CM | POA: Diagnosis not present

## 2019-11-13 DIAGNOSIS — H5203 Hypermetropia, bilateral: Secondary | ICD-10-CM | POA: Diagnosis not present

## 2019-12-28 DIAGNOSIS — Z23 Encounter for immunization: Secondary | ICD-10-CM | POA: Diagnosis not present

## 2020-03-11 ENCOUNTER — Ambulatory Visit: Payer: Medicare PPO | Admitting: Family Medicine

## 2020-03-11 ENCOUNTER — Other Ambulatory Visit: Payer: Self-pay

## 2020-03-11 ENCOUNTER — Encounter: Payer: Self-pay | Admitting: Family Medicine

## 2020-03-11 VITALS — BP 136/72 | Ht 69.5 in | Wt 150.0 lb

## 2020-03-11 DIAGNOSIS — M25561 Pain in right knee: Secondary | ICD-10-CM | POA: Diagnosis not present

## 2020-03-11 NOTE — Patient Instructions (Signed)
You have infrapatellar bursitis and synovitis of your knee. Ice area 15 minutes at a time 3-4 times a day as needed. Compression sleeve when up and walking around for next few weeks. Try diclofenac gel up to 4 times a day. If this isn't working well enough you can try aleve 2 tabs twice a day with food instead. Do home exercises once daily as directed. Consider physical therapy if not improving. Try to avoid deep squats, lunges, leg press. Follow up with me in 5-6 weeks.

## 2020-03-11 NOTE — Progress Notes (Signed)
PCP: [name], MD  Subjective:   HPI: Patient is a 80 y.o. male here for right knee pain.  Patient reports intermittent right knee pain for the past several years.  Typically his knee would swell for about a week and pain would go away without any treatment.  He noticed his pain was [facility] and stopped due to [facility] restrictions.  Recently, he started playing pickle ball.  Reports over the last 3 weeks he has had anterior right knee pain so he wanted to get it looked at.  Feels as if his pain is worse.  Has not taking anything but Tylenol for pain.  Of note, he slipped and fell while he was playing pickle ball and hit his right knee.  Frequently kneels while gardening.  Wears a knee pad.  Wife notes Keifer's deep knee squatting form is not as good as it was before.    Past Medical History:  Diagnosis Date  . BPH (benign prostatic hyperplasia)   . Colon polyps    adenomatous  . Iron deficiency anemia     Current Outpatient Medications on File Prior to Visit  Medication Sig Dispense Refill  . Multiple Vitamins-Minerals (MULTIVITAMIN PO) Take by mouth.    . sildenafil (VIAGRA) 100 MG tablet Take 100 mg by mouth as needed.     No current facility-administered medications on file prior to visit.    Past Surgical History:  Procedure Laterality Date  . COLONOSCOPY    . PROSTATE BIOPSY      Allergies  Allergen Reactions  . Sulfonamide Derivatives     REACTION: as child, unspecified    Social History   Socioeconomic History  . Marital status: Married    Spouse name: Not on file  . Number of children: Not on file  . Years of education: Not on file  . Highest education level: Not on file  Occupational History  . Occupation: Retired    Associate Professor: [employer]  Tobacco Use  . Smoking status: Former Smoker    Packs/day: 1.00    Years: 25.00    Pack years: 25.00    Types: Cigarettes    Quit date: 03/07/1982    Years since quitting: 38.0   . Smokeless tobacco: Never Used  Substance and Sexual Activity  . Alcohol use: Yes    Comment: 1-2 glasses of wine per day  . Drug use: No  . Sexual activity: Not on file  Other Topics Concern  . Not on file  Social History Narrative  . Not on file   Social Determinants of Health   Financial Resource Strain: Not on file  Food Insecurity: Not on file  Transportation Needs: Not on file  Physical Activity: Not on file  Stress: Not on file  Social Connections: Not on file  Intimate Partner Violence: Not on file    Family History  Problem Relation Age of Onset  . Hypertension Mother   . Heart disease Mother   . Colon cancer Neg Hx     BP 136/72   Ht 5' 9.5" (1.765 m)   Wt 150 lb (68 kg)   BMI 21.83 kg/m   [facility] 03/11/2020  Frequency of aerobic exercise (# of days/week) 6  Average time in minutes 60  Frequency of strengthening activities (# of days/week) 3    No flowsheet data found.  Review of Systems: See HPI above.     Objective:  Physical Exam:  Gen: NAD, comfortable in exam room Right knee Exam -Inspection: no deformity, no discoloration -Palpation:  No joint line tenderness -ROM: Full range of motion of extension and flexion.  Strength testing 5/5  -Special Tests: Varus Stress: Negative; Valgus Stress: Negative; Lachman: Negative; Posterior drawer: Negative; McMurray: Negative; Thessaly: Negative; Patellar grind: Negative -Limb neurovascularly intact, no instability noted  Limited MSK u/s Right knee: Mild effusion with synovitis.  Minimal arthropathy medially.  Mild superficial infrapatellar bursitis.  Patellar tendon normal without abnormalities  Assessment & Plan:  1.  Synovitis  2.  Infrapatellar bursitis    Bedside US performed.  Concerning for knee synovitis and post patellar bursitis.  No neo-vascularization noted.  Conservative treatment discussed.  See AVS for details.  Consider physical therapy if not improving.   Patient to follow-up in 6 weeks.  [name], DO PGY-2, [facility] 03/11/2020

## 2020-04-15 ENCOUNTER — Ambulatory Visit: Payer: Medicare PPO | Admitting: Family Medicine

## 2020-08-17 ENCOUNTER — Encounter: Payer: Self-pay | Admitting: Family Medicine

## 2020-08-17 ENCOUNTER — Ambulatory Visit: Payer: Medicare PPO | Admitting: Family Medicine

## 2020-08-17 ENCOUNTER — Other Ambulatory Visit: Payer: Self-pay

## 2020-08-17 VITALS — BP 132/82

## 2020-08-17 DIAGNOSIS — S76012A Strain of muscle, fascia and tendon of left hip, initial encounter: Secondary | ICD-10-CM

## 2020-08-17 NOTE — Patient Instructions (Addendum)
Good to see you again Chad Castillo! You have strained either your piriformis or your gluteus medius. Start stretches and strengthening exercises daily and do these for the next month (even if pain goes away). Ok to start walking again with your wife but start at 1 mile, increase by 0.5 miles each day as long as pain doesn't get worse than a 3 on a scale of 1-10. Wait 2 weeks before returning to pickleball though. Follow up with me in 1 month.

## 2020-08-17 NOTE — Assessment & Plan Note (Signed)
Acute. History and physical exam appear most consistent with gluteusmuscle strain. Recommended stretches and strengthening exercises.  Heat as needed.  Advance activity as tolerated. Follow up if no improvement or worsening in 2 to 3 weeks.

## 2020-08-17 NOTE — Progress Notes (Addendum)
   Subjective:   Patient ID: Chad Castillo    DOB: [birth year], 80 y.o. male   MRN: [id number]  Chad Castillo is a 80 y.o. male with a history of intestinal angiodysplasia, IDA, h/o colonic polyps here for "sore muscle"  HPI: Patient presents with left buttocks pain after he bent down suddenly to grab a ball when he was playing pickle ball.  He felt a "twang" immediately following this action but continue to play pickle ball.  He notes its been sore for about 3 weeks.  It does feel a little bit better since the pain first started and it is better when he walks and applies heat.  He presents today for advice on if he should continue to exercise and if there is any stretches he should do.  Has not been playing pickleball since and only walked around the block for exercise and has done well with this.  Review of Systems:  Per HPI.   Objective:   BP 132/82  Vitals and nursing note reviewed.  General: pleasant thin elderly male, sitting comfortably in exam chair, well nourished, well developed, in no acute distress with non-toxic appearance Resp: breathing comfortably on room air, speaking in full sentences Skin: warm, dry MSK: gait normal Left Hip:  - Inspection: No gross deformity, no swelling, erythema, or ecchymosis - Palpation: TTP along piriformis/gluteus medius muscle bellies, No TTP along greater trochanter, IT band - ROM: Normal range of motion on Flexion, extension, abduction, internal and external rotation - Strength: Weakness to resisted abduction on left, otherwise good strength - Neuro/vasc: NV intact distally - Special Tests: Negative FABER. Some reproduction of pain with FADIR but none in anterior hip. Negative Ober's. Trendelenburg negative  Right Hip:  - Inspection: No gross deformity, no swelling, erythema, or ecchymosis - Palpation: No TTP - ROM: Normal range of motion on Flexion, extension, abduction, internal and external rotation - Strength: Normal strength. - Special  Tests: Negative FABER and FADIR.  Negative Ober's. Trendelenburg negative  Neuro: Alert and oriented, speech normal  Assessment & Plan:   Strain of gluteus medius of left lower extremity Acute. History and physical exam appear most consistent with gluteusmuscle strain. Recommended stretches and strengthening exercises.  Heat as needed.  Advance activity as tolerated. Follow up if no improvement or worsening in 2 to 3 weeks.   No orders of the defined types were placed in this encounter.  No orders of the defined types were placed in this encounter.     Orpah Cobb, DO PGY-3, [facility] 08/17/2020 9:57 AM

## 2020-08-31 ENCOUNTER — Ambulatory Visit: Payer: Medicare PPO | Admitting: Family Medicine

## 2020-08-31 ENCOUNTER — Encounter: Payer: Self-pay | Admitting: Family Medicine

## 2020-08-31 ENCOUNTER — Other Ambulatory Visit: Payer: Self-pay

## 2020-08-31 VITALS — BP 138/60 | Ht 69.5 in | Wt 145.0 lb

## 2020-08-31 DIAGNOSIS — M5442 Lumbago with sciatica, left side: Secondary | ICD-10-CM

## 2020-08-31 MED ORDER — PREDNISONE 10 MG PO TABS: ORAL_TABLET | ORAL | 0 refills | Status: DC

## 2020-08-31 NOTE — Progress Notes (Signed)
PCP: [name], MD  Subjective:   HPI: Patient is a 80 y.o. male here for left hip/back pain.  6/13: Patient presents with left buttocks pain after he bent down suddenly to grab a ball when he was playing pickle ball.  He felt a "twang" immediately following this action but continue to play pickle ball.  He notes its been sore for about 3 weeks.  It does feel a little bit better since the pain first started and it is better when he walks and applies heat.  He presents today for advice on if he should continue to exercise and if there is any stretches he should do.  Has not been playing pickleball since and only walked around the block for exercise and has done well with this.  6/27: Patient reports he's been doing home exercises daily and walking. Pain has gotten worse however and now involves low back left and right side. Worse in the morning. Felt twinge in low back after picking something up remotely and would seize up on him. Has been using heat, stretching. Pain is a constant ache. No numbness, tingling. No bowel/bladder dysfunction.  Past Medical History:  Diagnosis Date   BPH (benign prostatic hyperplasia)    Colon polyps    adenomatous   Iron deficiency anemia     Current Outpatient Medications on File Prior to Visit  Medication Sig Dispense Refill   Multiple Vitamins-Minerals (MULTIVITAMIN PO) Take by mouth.     sildenafil (VIAGRA) 100 MG tablet Take 100 mg by mouth as needed.     No current facility-administered medications on file prior to visit.    Past Surgical History:  Procedure Laterality Date   COLONOSCOPY     PROSTATE BIOPSY      Allergies  Allergen Reactions   Sulfonamide Derivatives     REACTION: as child, unspecified    Social History   Socioeconomic History   Marital status: Married    Spouse name: Not on file   Number of children: Not on file   Years of education: Not on file   Highest education level: Not on file  Occupational  History   Occupation: Retired    Associate Professor: [employer]  Tobacco Use   Smoking status: Former    Packs/day: 1.00    Years: 25.00    Pack years: 25.00    Types: Cigarettes    Quit date: 03/07/1982    Years since quitting: 38.5   Smokeless tobacco: Never  Substance and Sexual Activity   Alcohol use: Yes    Comment: 1-2 glasses of wine per day   Drug use: No   Sexual activity: Not on file  Other Topics Concern   Not on file  Social History Narrative   Not on file   Social Determinants of Health   Financial Resource Strain: Not on file  Food Insecurity: Not on file  Transportation Needs: Not on file  Physical Activity: Not on file  Stress: Not on file  Social Connections: Not on file  Intimate Partner Violence: Not on file    Family History  Problem Relation Age of Onset   Hypertension Mother    Heart disease Mother    Colon cancer Neg Hx     BP 138/60   Ht 5' 9.5" (1.765 m)   Wt 145 lb (65.8 kg)   BMI 21.11 kg/m   [facility] 03/11/2020 08/17/2020  Frequency of aerobic exercise (# of days/week) 6 4  Average time  in minutes 60 90  Frequency of strengthening activities (# of days/week) 3 3    No flowsheet data found.  Review of Systems: See HPI above.     Objective:  Physical Exam:  Gen: NAD, comfortable in exam room  Back: No gross deformity, scoliosis. No paraspinal TTP.  No midline or bony TTP. FROM with pain low back flexion and extension. Strength LEs 5/5 all muscle groups.   Trace MSRs in patellar and 2+ achilles tendons, equal bilaterally. Negative SLRs. Sensation intact to light touch bilaterally.  Left hip: No deformity. FROM with 5/5 strength. No tenderness to palpation. NVI distally. Negative logroll. Negative faber, fadir, and piriformis stretches.   Assessment & Plan:  1. Low back pain with radiation into left hip - worsening with home exercise program.  Now involving low back.  Suspect pain is due to  spinal stenosis or radiculopathy with radiation into gluteal musculature.  Start prednisone dose pack, physical therapy.  F/u in 1 month but let us know how he's doing in 1 week.

## 2020-08-31 NOTE — Patient Instructions (Signed)
I suspect you have spinal stenosis with a nerve irritated going into this left leg. Ok to take tylenol if needed. Take prednisone dose pack as directed for 6 days. Don't take aleve or ibuprofen while on the prednisone. Stay as active as possible. Physical therapy has been shown to be helpful as well - start this. Avoid twisting, stooping, bending (especially bending backwards) as much as possible. No lifting more than 15 pounds. Strengthening of low back muscles, abdominal musculature are key for long term pain relief. If not improving, will consider further imaging (MRI). Follow up with me in 1 month but message me in a week to let me know how you're doing after the prednisone.

## 2020-09-03 DIAGNOSIS — M545 Low back pain, unspecified: Secondary | ICD-10-CM | POA: Diagnosis not present

## 2020-09-08 DIAGNOSIS — M545 Low back pain, unspecified: Secondary | ICD-10-CM | POA: Diagnosis not present

## 2020-09-10 DIAGNOSIS — M545 Low back pain, unspecified: Secondary | ICD-10-CM | POA: Diagnosis not present

## 2020-09-11 ENCOUNTER — Other Ambulatory Visit: Payer: Self-pay

## 2020-09-11 DIAGNOSIS — M5442 Lumbago with sciatica, left side: Secondary | ICD-10-CM

## 2020-09-11 DIAGNOSIS — H353121 Nonexudative age-related macular degeneration, left eye, early dry stage: Secondary | ICD-10-CM | POA: Diagnosis not present

## 2020-09-11 DIAGNOSIS — H5203 Hypermetropia, bilateral: Secondary | ICD-10-CM | POA: Diagnosis not present

## 2020-09-11 DIAGNOSIS — H2513 Age-related nuclear cataract, bilateral: Secondary | ICD-10-CM | POA: Diagnosis not present

## 2020-09-11 MED ORDER — DICLOFENAC SODIUM 75 MG PO TBEC: 75.0000 mg | DELAYED_RELEASE_TABLET | Freq: Two times a day (BID) | ORAL | 0 refills | Status: DC

## 2020-09-11 NOTE — Progress Notes (Signed)
Pt called stating that lower back pain is getting worse everyday. Prednisone is not working. Pt wants to discuss next step and possible MRI.   Per Dr. Pearletha Forge - Go ahead with MRI.  We could repeat the prednisone in a longer course if it seemed to help the first couple days.  Or do oral voltaren twice a day to try to give him some relief while awaiting the MRI.   Pt would like to try oral voltaren. We will send this to his pharmacy and work on getting an MRI as soon as we can.  Pt understands and agrees with the plan.

## 2020-09-16 ENCOUNTER — Ambulatory Visit
Admission: RE | Admit: 2020-09-16 | Discharge: 2020-09-16 | Disposition: A | Discharge status: Home / Self Care | Payer: Medicare PPO | Source: Ambulatory Visit | Attending: Family Medicine | Admitting: Family Medicine

## 2020-09-16 ENCOUNTER — Other Ambulatory Visit: Payer: Self-pay

## 2020-09-16 DIAGNOSIS — M5442 Lumbago with sciatica, left side: Secondary | ICD-10-CM

## 2020-09-16 DIAGNOSIS — M48061 Spinal stenosis, lumbar region without neurogenic claudication: Secondary | ICD-10-CM | POA: Diagnosis not present

## 2020-09-16 DIAGNOSIS — M545 Low back pain, unspecified: Secondary | ICD-10-CM | POA: Diagnosis not present

## 2020-09-18 ENCOUNTER — Other Ambulatory Visit: Payer: Self-pay | Admitting: Family Medicine

## 2020-09-18 ENCOUNTER — Other Ambulatory Visit: Payer: Self-pay

## 2020-09-18 DIAGNOSIS — M5442 Lumbago with sciatica, left side: Secondary | ICD-10-CM

## 2020-09-18 DIAGNOSIS — G8929 Other chronic pain: Secondary | ICD-10-CM

## 2020-09-18 MED ORDER — HYDROCODONE-ACETAMINOPHEN 7.5-325 MG PO TABS: 1.0000 | ORAL_TABLET | ORAL | 0 refills | Status: DC | PRN

## 2020-09-18 NOTE — Addendum Note (Signed)
Addended by: Lenda Kelp on: 09/18/2020 09:47 AM   Modules accepted: Orders

## 2020-09-21 ENCOUNTER — Other Ambulatory Visit: Payer: Self-pay

## 2020-09-21 ENCOUNTER — Ambulatory Visit
Admission: RE | Admit: 2020-09-21 | Discharge: 2020-09-21 | Disposition: A | Discharge status: Home / Self Care | Payer: Medicare PPO | Source: Ambulatory Visit | Attending: Family Medicine | Admitting: Family Medicine

## 2020-09-21 DIAGNOSIS — M5442 Lumbago with sciatica, left side: Secondary | ICD-10-CM

## 2020-09-21 DIAGNOSIS — M47817 Spondylosis without myelopathy or radiculopathy, lumbosacral region: Secondary | ICD-10-CM | POA: Diagnosis not present

## 2020-09-21 DIAGNOSIS — M5126 Other intervertebral disc displacement, lumbar region: Secondary | ICD-10-CM | POA: Diagnosis not present

## 2020-09-21 DIAGNOSIS — G8929 Other chronic pain: Secondary | ICD-10-CM

## 2020-09-21 MED ORDER — IOPAMIDOL (ISOVUE-M 200) INJECTION 41%
1.0000 mL | Freq: Once | INTRAMUSCULAR | Status: AC
  Administered 2020-09-21: 1 mL via EPIDURAL

## 2020-09-21 MED ORDER — METHYLPREDNISOLONE ACETATE 40 MG/ML INJ SUSP (RADIOLOG
80.0000 mg | Freq: Once | INTRAMUSCULAR | Status: AC
  Administered 2020-09-21: 80 mg via EPIDURAL

## 2020-09-21 NOTE — Discharge Instructions (Signed)

## 2020-09-24 ENCOUNTER — Telehealth: Payer: Self-pay | Admitting: Family Medicine

## 2020-09-24 ENCOUNTER — Other Ambulatory Visit: Payer: Self-pay | Admitting: Family Medicine

## 2020-09-24 MED ORDER — HYDROCODONE-ACETAMINOPHEN 7.5-325 MG PO TABS: 1.0000 | ORAL_TABLET | ORAL | 0 refills | Status: DC | PRN

## 2020-09-24 NOTE — Progress Notes (Signed)
Patient requested refill of hydrocodone.  Had ESI 3 days ago - may not have started to provide benefit yet.  Also referred to neurosurgery.  Will refill medication.

## 2020-09-24 NOTE — Telephone Encounter (Signed)
Pt's wife called states pt is having a Reaction to injection & ask for someone to call them ASAP [phone]

## 2020-09-30 ENCOUNTER — Encounter: Payer: Self-pay | Admitting: Family Medicine

## 2020-09-30 ENCOUNTER — Other Ambulatory Visit: Payer: Self-pay | Admitting: Family Medicine

## 2020-09-30 ENCOUNTER — Ambulatory Visit: Payer: Medicare PPO | Admitting: Family Medicine

## 2020-09-30 ENCOUNTER — Other Ambulatory Visit: Payer: Self-pay

## 2020-09-30 VITALS — Ht 69.0 in | Wt 135.0 lb

## 2020-09-30 DIAGNOSIS — R030 Elevated blood-pressure reading, without diagnosis of hypertension: Secondary | ICD-10-CM | POA: Diagnosis not present

## 2020-09-30 DIAGNOSIS — M5442 Lumbago with sciatica, left side: Secondary | ICD-10-CM

## 2020-09-30 DIAGNOSIS — M5126 Other intervertebral disc displacement, lumbar region: Secondary | ICD-10-CM | POA: Diagnosis not present

## 2020-09-30 DIAGNOSIS — G8929 Other chronic pain: Secondary | ICD-10-CM

## 2020-09-30 MED ORDER — HYDROCODONE-ACETAMINOPHEN 5-325 MG PO TABS: 1.0000 | ORAL_TABLET | Freq: Four times a day (QID) | ORAL | 0 refills | Status: AC | PRN

## 2020-09-30 NOTE — Progress Notes (Signed)
PCP: [name], MD  Subjective:   HPI: Patient is a 80 y.o. male here for left hip/back pain.  6/13: Patient presents with left buttocks pain after he bent down suddenly to grab a ball when he was playing pickle ball.  He felt a "twang" immediately following this action but continue to play pickle ball.  He notes its been sore for about 3 weeks.  It does feel a little bit better since the pain first started and it is better when he walks and applies heat.  He presents today for advice on if he should continue to exercise and if there is any stretches he should do.  Has not been playing pickleball since and only walked around the block for exercise and has done well with this.  6/27: Patient reports he's been doing home exercises daily and walking. Pain has gotten worse however and now involves low back left and right side. Worse in the morning. Felt twinge in low back after picking something up remotely and would seize up on him. Has been using heat, stretching. Pain is a constant ache. No numbness, tingling. No bowel/bladder dysfunction.  7/27: Patient reports he has improved since last visit. Took about a week following ESI but pain has improved. Taking hydrocodone regularly and diclofenac twice a day. Not sure if he needs the hydrocodone regularly. Sleeping better at night. No numbness, tingling, bowel or bladder dysfunction. Still walking with short strides. Has appointment with Dr. Danielle Dess later this morning.  Past Medical History:  Diagnosis Date   BPH (benign prostatic hyperplasia)    Colon polyps    adenomatous   Iron deficiency anemia     Current Outpatient Medications on File Prior to Visit  Medication Sig Dispense Refill   diclofenac (VOLTAREN) 75 MG EC tablet Take 1 tablet (75 mg total) by mouth 2 (two) times daily. Take with food. 60 tablet 0   Multiple Vitamins-Minerals (MULTIVITAMIN PO) Take by mouth.     predniSONE (DELTASONE) 10 MG tablet 6 tabs po day  1, 5 tabs po day 2, 4 tabs po day 3, 3 tabs po day 4, 2 tabs po day 5, 1 tab po day 6 21 tablet 0   sildenafil (VIAGRA) 100 MG tablet Take 100 mg by mouth as needed.     No current facility-administered medications on file prior to visit.    Past Surgical History:  Procedure Laterality Date   COLONOSCOPY     PROSTATE BIOPSY      Allergies  Allergen Reactions   Sulfonamide Derivatives     REACTION: as child, unspecified    Social History   Socioeconomic History   Marital status: Married    Spouse name: Not on file   Number of children: Not on file   Years of education: Not on file   Highest education level: Not on file  Occupational History   Occupation: Retired    Associate Professor: [employer]  Tobacco Use   Smoking status: Former    Packs/day: 1.00    Years: 25.00    Pack years: 25.00    Types: Cigarettes    Quit date: 03/07/1982    Years since quitting: 38.5   Smokeless tobacco: Never  Substance and Sexual Activity   Alcohol use: Yes    Comment: 1-2 glasses of wine per day   Drug use: No   Sexual activity: Not on file  Other Topics Concern   Not on file  Social History Narrative   Not on  file   Social Determinants of Health   Financial Resource Strain: Not on file  Food Insecurity: Not on file  Transportation Needs: Not on file  Physical Activity: Not on file  Stress: Not on file  Social Connections: Not on file  Intimate Partner Violence: Not on file    Family History  Problem Relation Age of Onset   Hypertension Mother    Heart disease Mother    Colon cancer Neg Hx     Ht 5\' 9"  (1.753 m)   Wt 135 lb (61.2 kg)   BMI 19.94 kg/m   [facility] 03/11/2020 08/17/2020  Frequency of aerobic exercise (# of days/week) 6 4  Average time in minutes 60 90  Frequency of strengthening activities (# of days/week) 3 3    No flowsheet data found.  Review of Systems: See HPI above.     Objective:  Physical Exam:  Gen: NAD,  comfortable in exam room  Back: No gross deformity, scoliosis. No paraspinal TTP.  No midline or bony TTP. FROM. Strength LEs 5/5 all muscle groups.   Negative SLRs. Sensation intact to light touch bilaterally.   Assessment & Plan:  1. Low back pain with radiation into left hip - 2/2 left S1 radiculopathy.  Improved following 1 injection but incompletely.  Will set up second injection.  Diclofenac twice a day.  Wean from hydrocodone as tolerated.  Restart physical about a week after second injection.

## 2020-09-30 NOTE — Patient Instructions (Signed)
We will set you up for a second injection for your back. Taper down off the hydrocodone - see if you can do without it now and just take half a tablet when needed. When you're not needing the hydrocodone at all then try to taper off the diclofenac. Message or call me with any questions though I'd like to know how you're doing about a week after this second injection.

## 2020-10-07 ENCOUNTER — Other Ambulatory Visit: Payer: Self-pay | Admitting: Family Medicine

## 2020-10-09 ENCOUNTER — Ambulatory Visit
Admission: RE | Admit: 2020-10-09 | Discharge: 2020-10-09 | Disposition: A | Discharge status: Home / Self Care | Payer: Medicare PPO | Source: Ambulatory Visit | Attending: Family Medicine | Admitting: Family Medicine

## 2020-10-09 DIAGNOSIS — G8929 Other chronic pain: Secondary | ICD-10-CM

## 2020-10-09 DIAGNOSIS — M5126 Other intervertebral disc displacement, lumbar region: Secondary | ICD-10-CM | POA: Diagnosis not present

## 2020-10-09 DIAGNOSIS — M47817 Spondylosis without myelopathy or radiculopathy, lumbosacral region: Secondary | ICD-10-CM | POA: Diagnosis not present

## 2020-10-09 MED ORDER — METHYLPREDNISOLONE ACETATE 40 MG/ML INJ SUSP (RADIOLOG
80.0000 mg | Freq: Once | INTRAMUSCULAR | Status: AC
  Administered 2020-10-09: 80 mg via EPIDURAL

## 2020-10-09 MED ORDER — IOPAMIDOL (ISOVUE-M 200) INJECTION 41%
1.0000 mL | Freq: Once | INTRAMUSCULAR | Status: AC
  Administered 2020-10-09: 1 mL via EPIDURAL

## 2020-10-09 NOTE — Discharge Instructions (Signed)

## 2020-11-04 DIAGNOSIS — M5126 Other intervertebral disc displacement, lumbar region: Secondary | ICD-10-CM | POA: Diagnosis not present

## 2020-11-19 DIAGNOSIS — M5126 Other intervertebral disc displacement, lumbar region: Secondary | ICD-10-CM | POA: Diagnosis not present

## 2020-11-19 DIAGNOSIS — M545 Low back pain, unspecified: Secondary | ICD-10-CM | POA: Diagnosis not present

## 2020-11-23 DIAGNOSIS — M545 Low back pain, unspecified: Secondary | ICD-10-CM | POA: Diagnosis not present

## 2020-11-23 DIAGNOSIS — M5126 Other intervertebral disc displacement, lumbar region: Secondary | ICD-10-CM | POA: Diagnosis not present

## 2020-11-26 DIAGNOSIS — M545 Low back pain, unspecified: Secondary | ICD-10-CM | POA: Diagnosis not present

## 2020-11-26 DIAGNOSIS — M5126 Other intervertebral disc displacement, lumbar region: Secondary | ICD-10-CM | POA: Diagnosis not present

## 2020-11-30 ENCOUNTER — Ambulatory Visit: Payer: Medicare PPO | Admitting: Family Medicine

## 2020-11-30 VITALS — Ht 69.0 in | Wt 140.0 lb

## 2020-11-30 DIAGNOSIS — M545 Low back pain, unspecified: Secondary | ICD-10-CM | POA: Diagnosis not present

## 2020-11-30 DIAGNOSIS — M5126 Other intervertebral disc displacement, lumbar region: Secondary | ICD-10-CM | POA: Diagnosis not present

## 2020-11-30 DIAGNOSIS — M5442 Lumbago with sciatica, left side: Secondary | ICD-10-CM

## 2020-11-30 NOTE — Progress Notes (Signed)
PCP: [name], MD  Subjective:   HPI: Patient is a 80 y.o. male here for left hip/back pain.  6/13: Patient presents with left buttocks pain after he bent down suddenly to grab a ball when he was playing pickle ball.  He felt a "twang" immediately following this action but continue to play pickle ball.  He notes its been sore for about 3 weeks.  It does feel a little bit better since the pain first started and it is better when he walks and applies heat.  He presents today for advice on if he should continue to exercise and if there is any stretches he should do.  Has not been playing pickleball since and only walked around the block for exercise and has done well with this.  6/27: Patient reports he's been doing home exercises daily and walking. Pain has gotten worse however and now involves low back left and right side. Worse in the morning. Felt twinge in low back after picking something up remotely and would seize up on him. Has been using heat, stretching. Pain is a constant ache. No numbness, tingling. No bowel/bladder dysfunction.  7/27: Patient reports he has improved since last visit. Took about a week following ESI but pain has improved. Taking hydrocodone regularly and diclofenac twice a day. Not sure if he needs the hydrocodone regularly. Sleeping better at night. No numbness, tingling, bowel or bladder dysfunction. Still walking with short strides. Has appointment with Dr. Danielle Dess later this morning.  9/26: Patient returns reporting he's doing very well. About 2 weeks after his second ESI his pain disappeared. He's restarted physical therapy and gone to 3 visits so far, doing home exercises. Gets some achiness in upper left leg with long walking and occasionally with PT. No bowel/bladder dysfunction. No numbness/tingling. Taking diclofenac once a day now, no longer needing hydrocodone.  Past Medical History:  Diagnosis Date   BPH (benign prostatic  hyperplasia)    Colon polyps    adenomatous   Iron deficiency anemia     Current Outpatient Medications on File Prior to Visit  Medication Sig Dispense Refill   diclofenac (VOLTAREN) 75 MG EC tablet TAKE ONE TABLET BY MOUTH TWICE DAILY WITH FOOD 60 tablet 0   HYDROcodone-acetaminophen (NORCO) 5-325 MG tablet Take 1 tablet by mouth every 6 (six) hours as needed for moderate pain. 20 tablet 0   Multiple Vitamins-Minerals (MULTIVITAMIN PO) Take by mouth.     predniSONE (DELTASONE) 10 MG tablet 6 tabs po day 1, 5 tabs po day 2, 4 tabs po day 3, 3 tabs po day 4, 2 tabs po day 5, 1 tab po day 6 21 tablet 0   sildenafil (VIAGRA) 100 MG tablet Take 100 mg by mouth as needed.     No current facility-administered medications on file prior to visit.    Past Surgical History:  Procedure Laterality Date   COLONOSCOPY     PROSTATE BIOPSY      Allergies  Allergen Reactions   Sulfonamide Derivatives     REACTION: as child, unspecified    Social History   Socioeconomic History   Marital status: Married    Spouse name: Not on file   Number of children: Not on file   Years of education: Not on file   Highest education level: Not on file  Occupational History   Occupation: Retired    Associate Professor: [employer]  Tobacco Use   Smoking status: Former    Packs/day: 1.00  Years: 25.00    Pack years: 25.00    Types: Cigarettes    Quit date: 03/07/1982    Years since quitting: 38.7   Smokeless tobacco: Never  Substance and Sexual Activity   Alcohol use: Yes    Comment: 1-2 glasses of wine per day   Drug use: No   Sexual activity: Not on file  Other Topics Concern   Not on file  Social History Narrative   Not on file   Social Determinants of Health   Financial Resource Strain: Not on file  Food Insecurity: Not on file  Transportation Needs: Not on file  Physical Activity: Not on file  Stress: Not on file  Social Connections: Not on file  Intimate Partner Violence: Not on file     Family History  Problem Relation Age of Onset   Hypertension Mother    Heart disease Mother    Colon cancer Neg Hx     Ht 5\' 9"  (1.753 m)   Wt 140 lb (63.5 kg)   BMI 20.67 kg/m   [facility] 03/11/2020 08/17/2020 11/30/2020  Frequency of aerobic exercise (# of days/week) 6 4 6   Average time in minutes 60 90 30  Frequency of strengthening activities (# of days/week) 3 3 0    No flowsheet data found.  Review of Systems: See HPI above.     Objective:  Physical Exam:  Gen: NAD, comfortable in exam room  No gross deformity, scoliosis. No TTP.  No midline or bony TTP. FROM. Strength LEs 5/5 all muscle groups.  Slight weakness left knee extension, flexion 2+ MSRs in patellar and 1+ achilles tendons, equal bilaterally. Negative SLRs. Sensation intact to light touch bilaterally. Negative logroll bilateral hips.  Assessment & Plan:  1. Low back pain with radiation into left hip - 2/2 left S1 radiculopathy - improved following 2 ESIs.  Has seen Dr. 12/02/2020.  Continue physical therapy, increasing exercise tolerance and strengthening of left leg.  Diclofenac only if needed.  Would wait on returning to pickleball until seeing him back in 1 month.

## 2020-11-30 NOTE — Patient Instructions (Signed)
You're doing great! Continue with physical therapy 1-2 times a week for the next 4 weeks. Do home exercises on days you don't go to therapy. Start to increase each week your walking distance (so maybe 2.25 miles each day this week, 2.5 miles each day next week, etc). I would not return to pickleball yet. Diclofenac only if needed now - if you're down to 4-5 pills and it seems you do need it more than you think call the pharmacy and I'll send in a refill. Follow up with me in about 1 month - hopefully you'll be ready at that time to return to pickleball.

## 2020-12-07 DIAGNOSIS — M545 Low back pain, unspecified: Secondary | ICD-10-CM | POA: Diagnosis not present

## 2020-12-07 DIAGNOSIS — M5126 Other intervertebral disc displacement, lumbar region: Secondary | ICD-10-CM | POA: Diagnosis not present

## 2020-12-10 DIAGNOSIS — M5126 Other intervertebral disc displacement, lumbar region: Secondary | ICD-10-CM | POA: Diagnosis not present

## 2020-12-10 DIAGNOSIS — M545 Low back pain, unspecified: Secondary | ICD-10-CM | POA: Diagnosis not present

## 2020-12-14 DIAGNOSIS — M5126 Other intervertebral disc displacement, lumbar region: Secondary | ICD-10-CM | POA: Diagnosis not present

## 2020-12-14 DIAGNOSIS — M545 Low back pain, unspecified: Secondary | ICD-10-CM | POA: Diagnosis not present

## 2020-12-17 DIAGNOSIS — M545 Low back pain, unspecified: Secondary | ICD-10-CM | POA: Diagnosis not present

## 2020-12-17 DIAGNOSIS — M5126 Other intervertebral disc displacement, lumbar region: Secondary | ICD-10-CM | POA: Diagnosis not present

## 2020-12-22 DIAGNOSIS — M545 Low back pain, unspecified: Secondary | ICD-10-CM | POA: Diagnosis not present

## 2020-12-22 DIAGNOSIS — M5126 Other intervertebral disc displacement, lumbar region: Secondary | ICD-10-CM | POA: Diagnosis not present

## 2020-12-24 DIAGNOSIS — M5126 Other intervertebral disc displacement, lumbar region: Secondary | ICD-10-CM | POA: Diagnosis not present

## 2020-12-24 DIAGNOSIS — M545 Low back pain, unspecified: Secondary | ICD-10-CM | POA: Diagnosis not present

## 2021-01-04 ENCOUNTER — Ambulatory Visit (INDEPENDENT_AMBULATORY_CARE_PROVIDER_SITE_OTHER): Payer: Medicare PPO | Admitting: Family Medicine

## 2021-01-04 ENCOUNTER — Other Ambulatory Visit: Payer: Self-pay

## 2021-01-04 ENCOUNTER — Encounter: Payer: Self-pay | Admitting: Family Medicine

## 2021-01-04 ENCOUNTER — Telehealth: Payer: Self-pay

## 2021-01-04 VITALS — BP 142/74 | Ht 69.0 in | Wt 150.0 lb

## 2021-01-04 DIAGNOSIS — M5442 Lumbago with sciatica, left side: Secondary | ICD-10-CM | POA: Diagnosis not present

## 2021-01-04 MED ORDER — DICLOFENAC SODIUM 75 MG PO TBEC
75.0000 mg | DELAYED_RELEASE_TABLET | Freq: Two times a day (BID) | ORAL | 1 refills | Status: AC
Start: 2021-01-04 — End: ?

## 2021-01-04 NOTE — Progress Notes (Signed)
PCP: [name], MD  Subjective:   HPI: Patient is a 80 y.o. male here for left hip/back pain.  6/13: Patient presents with left buttocks pain after he bent down suddenly to grab a ball when he was playing pickle ball.  He felt a "twang" immediately following this action but continue to play pickle ball.  He notes its been sore for about 3 weeks.  It does feel a little bit better since the pain first started and it is better when he walks and applies heat.  He presents today for advice on if he should continue to exercise and if there is any stretches he should do.  Has not been playing pickleball since and only walked around the block for exercise and has done well with this.  6/27: Patient reports he's been doing home exercises daily and walking. Pain has gotten worse however and now involves low back left and right side. Worse in the morning. Felt twinge in low back after picking something up remotely and would seize up on him. Has been using heat, stretching. Pain is a constant ache. No numbness, tingling. No bowel/bladder dysfunction.  7/27: Patient reports he has improved since last visit. Took about a week following ESI but pain has improved. Taking hydrocodone regularly and diclofenac twice a day. Not sure if he needs the hydrocodone regularly. Sleeping better at night. No numbness, tingling, bowel or bladder dysfunction. Still walking with short strides. Has appointment with Dr. Danielle Dess later this morning.  9/26: Patient returns reporting he's doing very well. About 2 weeks after his second ESI his pain disappeared. He's restarted physical therapy and gone to 3 visits so far, doing home exercises. Gets some achiness in upper left leg with long walking and occasionally with PT. No bowel/bladder dysfunction. No numbness/tingling. Taking diclofenac once a day now, no longer needing hydrocodone.  10/31: Patient reports he is doing well. Has been doing physical  therapy, home exercises. Gets some achiness left buttock area which resolved with diclofenac. Pain worse with prolonged driving but significantly better. Able to do his regular walking routine with typical speed as well. Curious about returning to pickleball.   Past Medical History:  Diagnosis Date   BPH (benign prostatic hyperplasia)    Colon polyps    adenomatous   Iron deficiency anemia     Current Outpatient Medications on File Prior to Visit  Medication Sig Dispense Refill   HYDROcodone-acetaminophen (NORCO) 5-325 MG tablet Take 1 tablet by mouth every 6 (six) hours as needed for moderate pain. 20 tablet 0   Multiple Vitamins-Minerals (MULTIVITAMIN PO) Take by mouth.     predniSONE (DELTASONE) 10 MG tablet 6 tabs po day 1, 5 tabs po day 2, 4 tabs po day 3, 3 tabs po day 4, 2 tabs po day 5, 1 tab po day 6 21 tablet 0   sildenafil (VIAGRA) 100 MG tablet Take 100 mg by mouth as needed.     No current facility-administered medications on file prior to visit.    Past Surgical History:  Procedure Laterality Date   COLONOSCOPY     PROSTATE BIOPSY      Allergies  Allergen Reactions   Sulfonamide Derivatives     REACTION: as child, unspecified    Social History   Socioeconomic History   Marital status: Married    Spouse name: Not on file   Number of children: Not on file   Years of education: Not on file   Highest education level: Not  on file  Occupational History   Occupation: Retired    Associate Professor: [employer]  Tobacco Use   Smoking status: Former    Packs/day: 1.00    Years: 25.00    Pack years: 25.00    Types: Cigarettes    Quit date: 03/07/1982    Years since quitting: 38.8   Smokeless tobacco: Never  Substance and Sexual Activity   Alcohol use: Yes    Comment: 1-2 glasses of wine per day   Drug use: No   Sexual activity: Not on file  Other Topics Concern   Not on file  Social History Narrative   Not on file   Social Determinants of Health    Financial Resource Strain: Not on file  Food Insecurity: Not on file  Transportation Needs: Not on file  Physical Activity: Not on file  Stress: Not on file  Social Connections: Not on file  Intimate Partner Violence: Not on file    Family History  Problem Relation Age of Onset   Hypertension Mother    Heart disease Mother    Colon cancer Neg Hx     BP (!) 142/74   Ht 5\' 9"  (1.753 m)   Wt 150 lb (68 kg)   BMI 22.15 kg/m   [facility] 03/11/2020 08/17/2020 11/30/2020 01/04/2021  Frequency of aerobic exercise (# of days/week) 6 4 6 6   Average time in minutes 60 90 30 30  Frequency of strengthening activities (# of days/week) 3 3 0 0    No flowsheet data found.  Review of Systems: See HPI above.     Objective:  Physical Exam:  Gen: NAD, comfortable in exam room  Back: No gross deformity, scoliosis. No TTP FROM. Strength LEs 5/5 all muscle groups.   2+ MSRs in patellar and 1+ achilles tendons, equal bilaterally. Negative SLRs. Sensation intact to light touch bilaterally. Negative logroll bilateral hips.  Assessment & Plan:  1. Low back pain with radiation into left hip - 2/2 left S1 radiculopathy.  Improved with 2 ESIs.  Has seen Dr. 01/06/2021 for consult as well.  Transition from PT to home exercises.  Diclofenac only if needed.  Discussed return to pickleball.  F/u prn.

## 2021-01-04 NOTE — Telephone Encounter (Signed)
Patient called stating that he wanted to see if he needed to continue to see go to physical therapy, please give patient a call concerning his question.

## 2021-01-08 DIAGNOSIS — M5126 Other intervertebral disc displacement, lumbar region: Secondary | ICD-10-CM | POA: Diagnosis not present

## 2021-01-08 DIAGNOSIS — M545 Low back pain, unspecified: Secondary | ICD-10-CM | POA: Diagnosis not present

## 2021-01-14 DIAGNOSIS — M545 Low back pain, unspecified: Secondary | ICD-10-CM | POA: Diagnosis not present

## 2021-01-14 DIAGNOSIS — M5126 Other intervertebral disc displacement, lumbar region: Secondary | ICD-10-CM | POA: Diagnosis not present

## 2021-01-27 ENCOUNTER — Encounter: Payer: Self-pay | Admitting: Family Medicine

## 2021-01-27 ENCOUNTER — Ambulatory Visit: Payer: Medicare PPO | Admitting: Family Medicine

## 2021-01-27 VITALS — BP 126/78 | Ht 69.0 in | Wt 145.0 lb

## 2021-01-27 DIAGNOSIS — M5442 Lumbago with sciatica, left side: Secondary | ICD-10-CM

## 2021-01-27 MED ORDER — PREDNISONE 10 MG PO TABS: ORAL_TABLET | ORAL | 0 refills | Status: AC

## 2021-01-27 NOTE — Progress Notes (Signed)
PCP: [name], MD  Subjective:   HPI: Patient is a 80 y.o. male here for left hip/back pain.  6/13: Patient presents with left buttocks pain after he bent down suddenly to grab a ball when he was playing pickle ball.  He felt a "twang" immediately following this action but continue to play pickle ball.  He notes its been sore for about 3 weeks.  It does feel a little bit better since the pain first started and it is better when he walks and applies heat.  He presents today for advice on if he should continue to exercise and if there is any stretches he should do.  Has not been playing pickleball since and only walked around the block for exercise and has done well with this.  6/27: Patient reports he's been doing home exercises daily and walking. Pain has gotten worse however and now involves low back left and right side. Worse in the morning. Felt twinge in low back after picking something up remotely and would seize up on him. Has been using heat, stretching. Pain is a constant ache. No numbness, tingling. No bowel/bladder dysfunction.  7/27: Patient reports he has improved since last visit. Took about a week following ESI but pain has improved. Taking hydrocodone regularly and diclofenac twice a day. Not sure if he needs the hydrocodone regularly. Sleeping better at night. No numbness, tingling, bowel or bladder dysfunction. Still walking with short strides. Has appointment with Dr. Danielle Dess later this morning.  9/26: Patient returns reporting he's doing very well. About 2 weeks after his second ESI his pain disappeared. He's restarted physical therapy and gone to 3 visits so far, doing home exercises. Gets some achiness in upper left leg with long walking and occasionally with PT. No bowel/bladder dysfunction. No numbness/tingling. Taking diclofenac once a day now, no longer needing hydrocodone.  10/31: Patient reports he is doing well. Has been doing physical  therapy, home exercises. Gets some achiness left buttock area which resolved with diclofenac. Pain worse with prolonged driving but significantly better. Able to do his regular walking routine with typical speed as well. Curious about returning to pickleball.  11/23: Unfortunately Juandedios has had recurrence of pain in left low back/buttock going down left leg. Not as severe as before but similar pain and distribution. Started later in the day after playing pickleball on Monday. Currently has some achiness but gets periods of throbbing especially at nighttime. No bowel/bladder dysfunction. No numbness.  Past Medical History:  Diagnosis Date   BPH (benign prostatic hyperplasia)    Colon polyps    adenomatous   Iron deficiency anemia     Current Outpatient Medications on File Prior to Visit  Medication Sig Dispense Refill   diclofenac (VOLTAREN) 75 MG EC tablet Take 1 tablet (75 mg total) by mouth 2 (two) times daily. 60 tablet 1   HYDROcodone-acetaminophen (NORCO) 5-325 MG tablet Take 1 tablet by mouth every 6 (six) hours as needed for moderate pain. 20 tablet 0   Multiple Vitamins-Minerals (MULTIVITAMIN PO) Take by mouth.     sildenafil (VIAGRA) 100 MG tablet Take 100 mg by mouth as needed.     No current facility-administered medications on file prior to visit.    Past Surgical History:  Procedure Laterality Date   COLONOSCOPY     PROSTATE BIOPSY      Allergies  Allergen Reactions   Sulfonamide Derivatives     REACTION: as child, unspecified    Social History   Socioeconomic  History   Marital status: Married    Spouse name: Not on file   Number of children: Not on file   Years of education: Not on file   Highest education level: Not on file  Occupational History   Occupation: Retired    Associate Professor: [employer]  Tobacco Use   Smoking status: Former    Packs/day: 1.00    Years: 25.00    Pack years: 25.00    Types: Cigarettes    Quit date: 03/07/1982    Years  since quitting: 38.9   Smokeless tobacco: Never  Substance and Sexual Activity   Alcohol use: Yes    Comment: 1-2 glasses of wine per day   Drug use: No   Sexual activity: Not on file  Other Topics Concern   Not on file  Social History Narrative   Not on file   Social Determinants of Health   Financial Resource Strain: Not on file  Food Insecurity: Not on file  Transportation Needs: Not on file  Physical Activity: Not on file  Stress: Not on file  Social Connections: Not on file  Intimate Partner Violence: Not on file    Family History  Problem Relation Age of Onset   Hypertension Mother    Heart disease Mother    Colon cancer Neg Hx     BP 126/78   Ht 5\' 9"  (1.753 m)   Wt 145 lb (65.8 kg)   BMI 21.41 kg/m   [facility] 03/11/2020 08/17/2020 11/30/2020 01/04/2021  Frequency of aerobic exercise (# of days/week) 6 4 6 6   Average time in minutes 60 90 30 30  Frequency of strengthening activities (# of days/week) 3 3 0 0    No flowsheet data found.  Review of Systems: See HPI above.     Objective:  Physical Exam:  Gen: NAD, comfortable in exam room  Back: No gross deformity, scoliosis. No paraspinal TTP.  No midline or bony TTP. FROM. Strength LEs 5/5 all muscle groups.   2+ MSRs in patellar and achilles tendons, equal bilaterally. Negative SLRs. Sensation intact to light touch bilaterally. Negative logroll bilateral hips.  Assessment & Plan:  1. Low back pain with radiation into left hip - left S1 radiculopathy.  Has done 2 ESIs and had some relief but pain has recurred.  Will rx prednisone dose pack.  Refer back to Dr. 01/06/2021 to discuss surgical intervention again.

## 2021-01-27 NOTE — Patient Instructions (Signed)
Take prednisone as directed until this is gone. Stop the diclofenac now. Take tylenol 1-2 tabs at breakfast and just after lunch. Take the hydrocodone only after dinner to help you get some sleep the next couple nights. We will get you in with Dr. Danielle Dess for a reevaluation since this has flared back up again.

## 2021-02-03 DIAGNOSIS — M5126 Other intervertebral disc displacement, lumbar region: Secondary | ICD-10-CM | POA: Diagnosis not present

## 2021-02-03 DIAGNOSIS — R030 Elevated blood-pressure reading, without diagnosis of hypertension: Secondary | ICD-10-CM | POA: Diagnosis not present

## 2021-02-10 DIAGNOSIS — M5117 Intervertebral disc disorders with radiculopathy, lumbosacral region: Secondary | ICD-10-CM | POA: Diagnosis not present

## 2021-02-10 DIAGNOSIS — M5126 Other intervertebral disc displacement, lumbar region: Secondary | ICD-10-CM | POA: Diagnosis not present

## 2021-05-26 DIAGNOSIS — Z0000 Encounter for general adult medical examination without abnormal findings: Secondary | ICD-10-CM | POA: Diagnosis not present

## 2021-05-26 DIAGNOSIS — Z1339 Encounter for screening examination for other mental health and behavioral disorders: Secondary | ICD-10-CM | POA: Diagnosis not present

## 2021-05-26 DIAGNOSIS — R7989 Other specified abnormal findings of blood chemistry: Secondary | ICD-10-CM | POA: Diagnosis not present

## 2021-05-26 DIAGNOSIS — Z125 Encounter for screening for malignant neoplasm of prostate: Secondary | ICD-10-CM | POA: Diagnosis not present

## 2021-05-26 DIAGNOSIS — R82998 Other abnormal findings in urine: Secondary | ICD-10-CM | POA: Diagnosis not present

## 2021-05-26 DIAGNOSIS — Z79899 Other long term (current) drug therapy: Secondary | ICD-10-CM | POA: Diagnosis not present

## 2021-05-26 DIAGNOSIS — Z9889 Other specified postprocedural states: Secondary | ICD-10-CM | POA: Diagnosis not present

## 2021-05-26 DIAGNOSIS — H9190 Unspecified hearing loss, unspecified ear: Secondary | ICD-10-CM | POA: Diagnosis not present

## 2021-05-26 DIAGNOSIS — Z1331 Encounter for screening for depression: Secondary | ICD-10-CM | POA: Diagnosis not present

## 2021-10-26 DIAGNOSIS — H353121 Nonexudative age-related macular degeneration, left eye, early dry stage: Secondary | ICD-10-CM | POA: Diagnosis not present

## 2021-10-26 DIAGNOSIS — H52203 Unspecified astigmatism, bilateral: Secondary | ICD-10-CM | POA: Diagnosis not present

## 2021-10-26 DIAGNOSIS — H2513 Age-related nuclear cataract, bilateral: Secondary | ICD-10-CM | POA: Diagnosis not present

## 2021-11-15 DIAGNOSIS — M545 Low back pain, unspecified: Secondary | ICD-10-CM | POA: Diagnosis not present

## 2021-11-22 DIAGNOSIS — M545 Low back pain, unspecified: Secondary | ICD-10-CM | POA: Diagnosis not present

## 2021-12-15 DIAGNOSIS — M545 Low back pain, unspecified: Secondary | ICD-10-CM | POA: Diagnosis not present

## 2022-05-30 DIAGNOSIS — N529 Male erectile dysfunction, unspecified: Secondary | ICD-10-CM | POA: Diagnosis not present

## 2022-05-30 DIAGNOSIS — Z125 Encounter for screening for malignant neoplasm of prostate: Secondary | ICD-10-CM | POA: Diagnosis not present

## 2022-05-30 DIAGNOSIS — Z79899 Other long term (current) drug therapy: Secondary | ICD-10-CM | POA: Diagnosis not present

## 2022-05-30 DIAGNOSIS — R7989 Other specified abnormal findings of blood chemistry: Secondary | ICD-10-CM | POA: Diagnosis not present

## 2022-06-06 DIAGNOSIS — Z1339 Encounter for screening examination for other mental health and behavioral disorders: Secondary | ICD-10-CM | POA: Diagnosis not present

## 2022-06-06 DIAGNOSIS — Z23 Encounter for immunization: Secondary | ICD-10-CM | POA: Diagnosis not present

## 2022-06-06 DIAGNOSIS — R82998 Other abnormal findings in urine: Secondary | ICD-10-CM | POA: Diagnosis not present

## 2022-06-06 DIAGNOSIS — N529 Male erectile dysfunction, unspecified: Secondary | ICD-10-CM | POA: Diagnosis not present

## 2022-06-06 DIAGNOSIS — Z0000 Encounter for general adult medical examination without abnormal findings: Secondary | ICD-10-CM | POA: Diagnosis not present

## 2022-06-06 DIAGNOSIS — Z1331 Encounter for screening for depression: Secondary | ICD-10-CM | POA: Diagnosis not present

## 2022-10-07 DIAGNOSIS — L723 Sebaceous cyst: Secondary | ICD-10-CM | POA: Diagnosis not present

## 2022-10-07 DIAGNOSIS — L729 Follicular cyst of the skin and subcutaneous tissue, unspecified: Secondary | ICD-10-CM | POA: Diagnosis not present

## 2022-11-18 DIAGNOSIS — H2513 Age-related nuclear cataract, bilateral: Secondary | ICD-10-CM | POA: Diagnosis not present

## 2022-11-18 DIAGNOSIS — H353121 Nonexudative age-related macular degeneration, left eye, early dry stage: Secondary | ICD-10-CM | POA: Diagnosis not present

## 2022-11-18 DIAGNOSIS — H52203 Unspecified astigmatism, bilateral: Secondary | ICD-10-CM | POA: Diagnosis not present

## 2022-11-21 IMAGING — MR MR LUMBAR SPINE W/O CM
4 of 5 series · 27 of 48 positions shown · non-contrast
Comparison: None.

CLINICAL DATA: Lumbar radiculopathy. Low back pain with bilateral
buttock pain

EXAM:
MRI LUMBAR SPINE WITHOUT CONTRAST
TECHNIQUE: Multiplanar, multisequence MR imaging of the lumbar spine was
performed. No intravenous contrast was administered.

[Series 2: T2 · sagittal · 4.0mm · 1.09mm/px · 6 of 17 slices shown (1 of 2)]
[im 1/17]
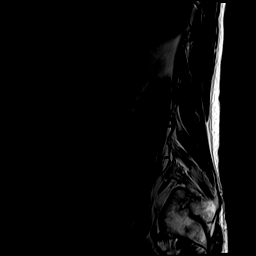
[im 4/17]
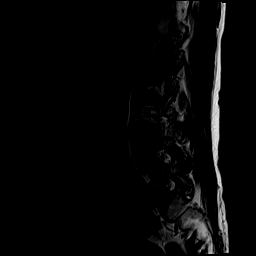
[im 7/17]
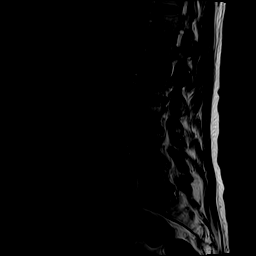
[im 10/17]
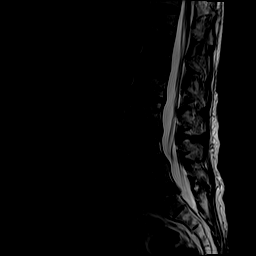
[im 13/17]
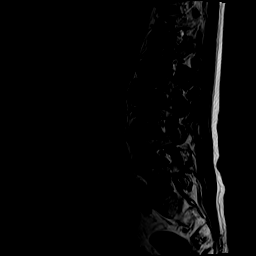
[im 17/17]
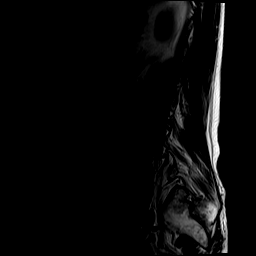

[Series 4: T1 · sagittal · 4.0mm · 1.09mm/px · 6 of 17 slices shown (1 of 2)]
[im 1/17]
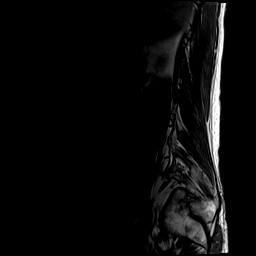
[im 4/17]
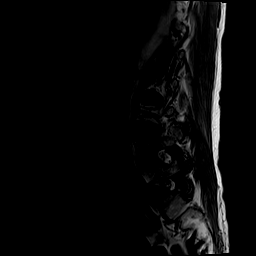
[im 7/17]
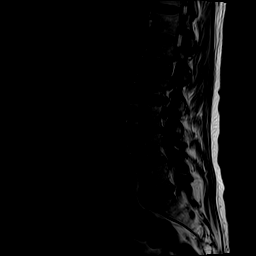
[im 10/17]
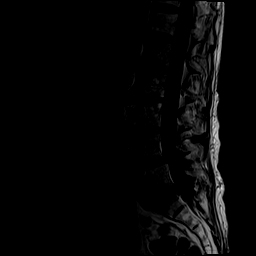
[im 13/17]
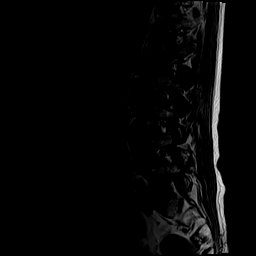
[im 17/17]
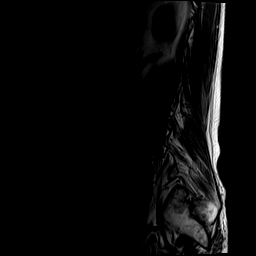

[Series 5: T2 · axial · 4.0mm · 0.39mm/px · z∈[-45,+176]mm · 9 of 42 slices shown (2 of 2)]
[im 1/42]
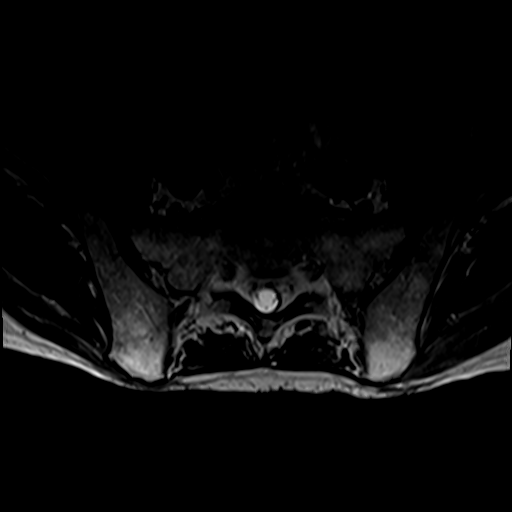
[im 6/42]
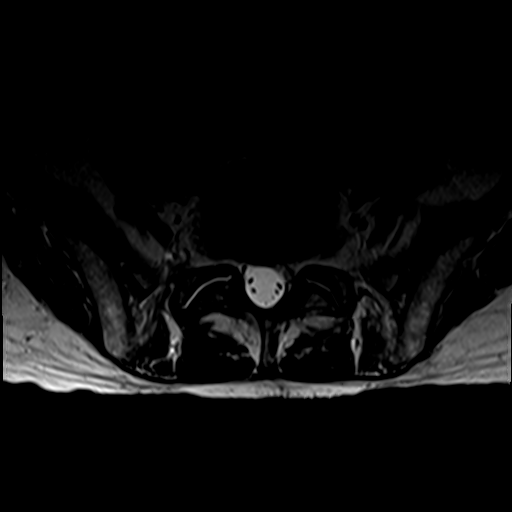
[im 12/42]
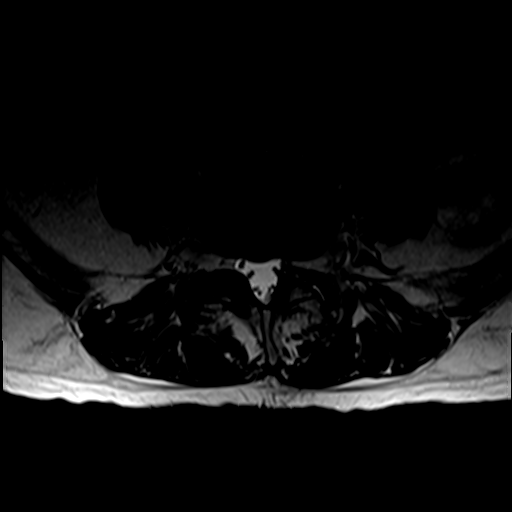
[im 18/42]
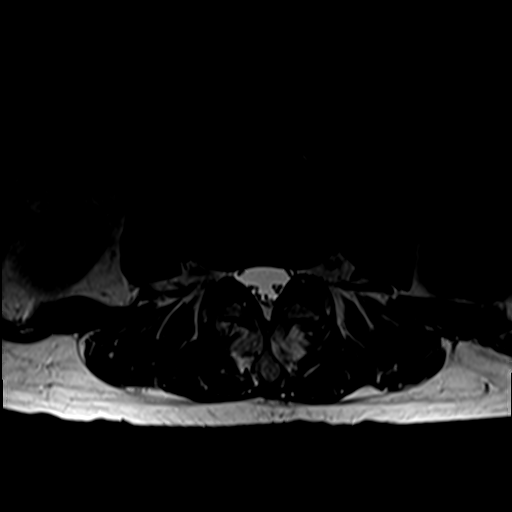
[im 21/42]
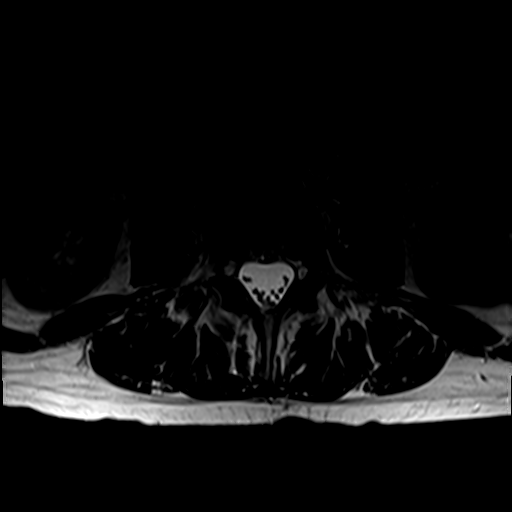
[im 24/42]
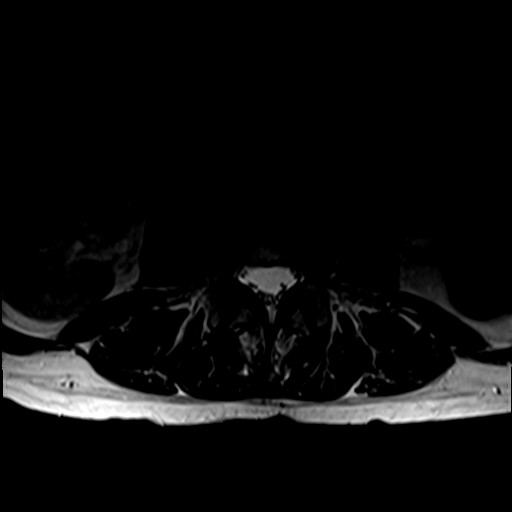
[im 30/42]
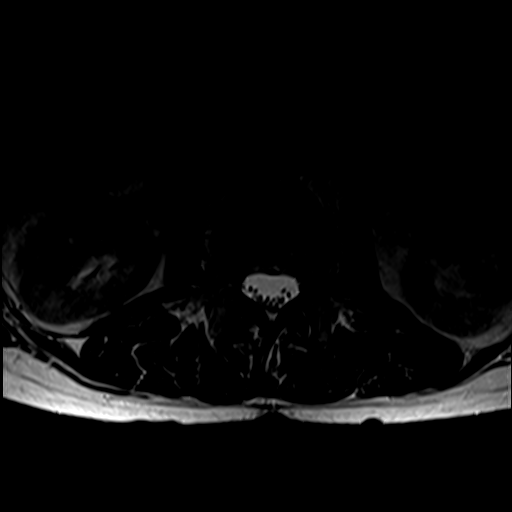
[im 36/42]
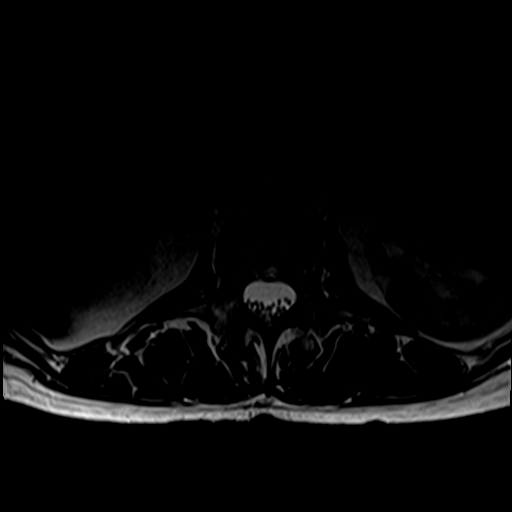
[im 42/42]
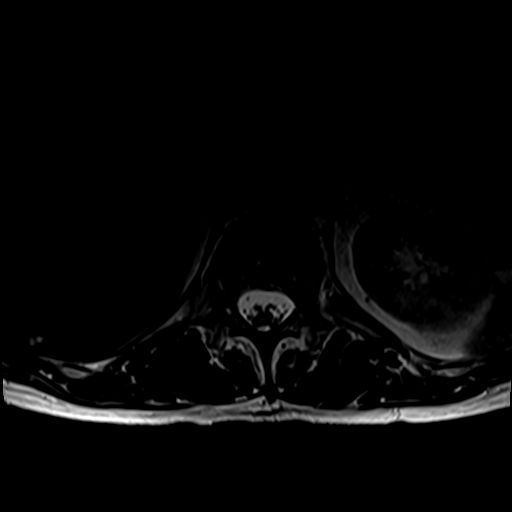

[Series 6: T1 · axial · 4.0mm · 0.39mm/px · z∈[-45,+148]mm · 6 of 42 slices shown (2 of 2)]
[im 1/42]
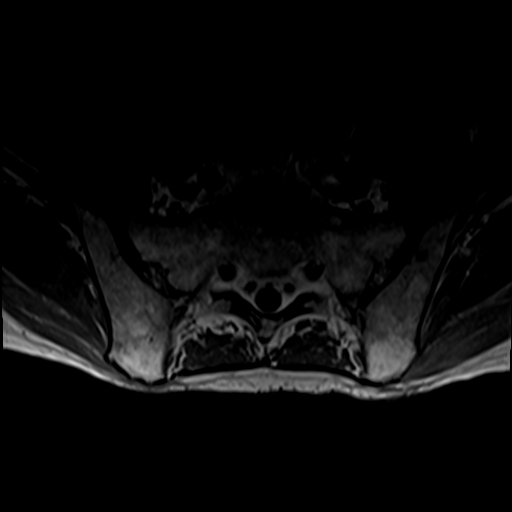
[im 6/42]
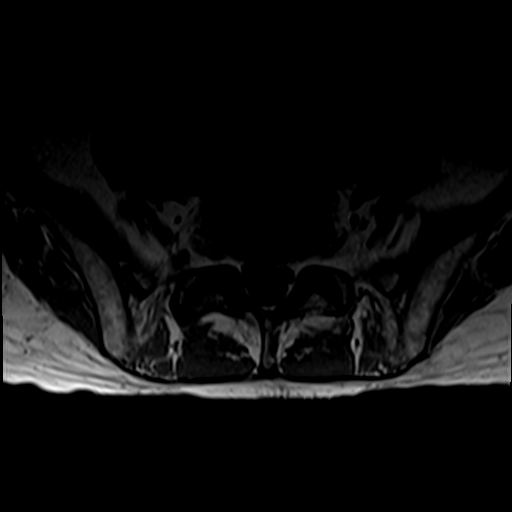
[im 12/42]
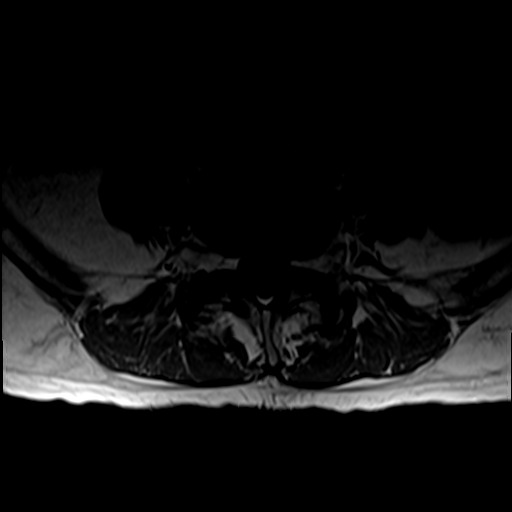
[im 18/42]
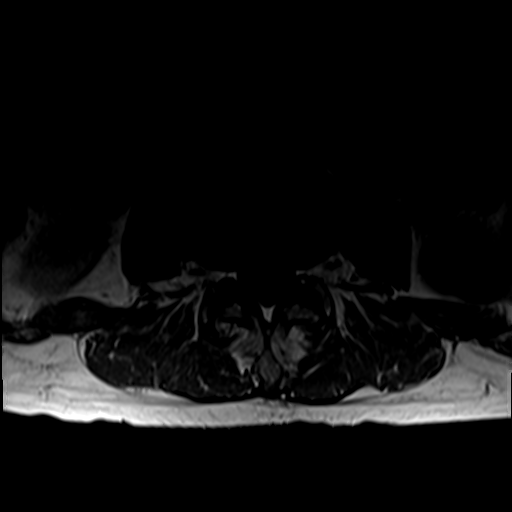
[im 21/42]
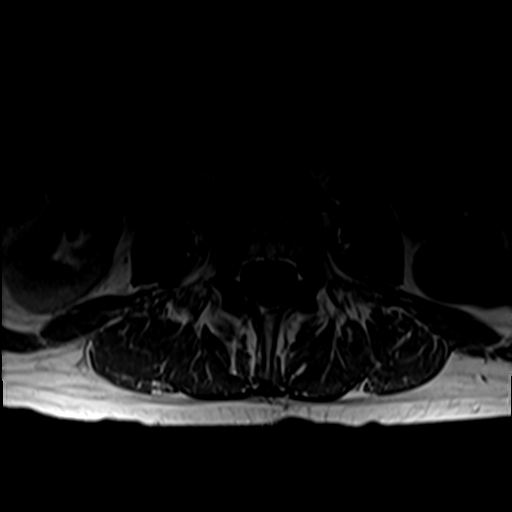
[im 36/42]
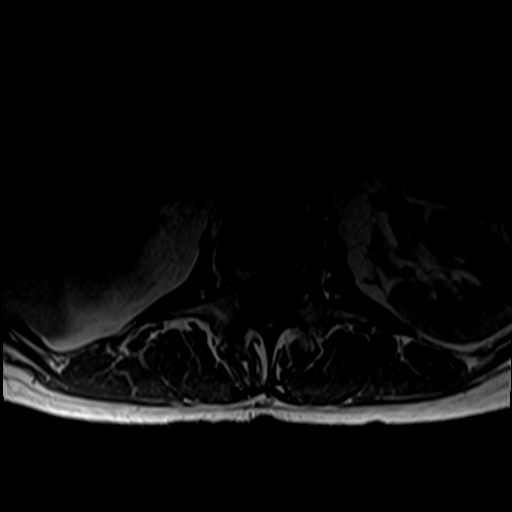

[27 of 48 positions shown; findings below may reference images not displayed]

FINDINGS: Segmentation:  Normal

Alignment:  4 mm anterolisthesis L4-5 otherwise normal alignment

Vertebrae:  Negative for fracture or mass.

Conus medullaris and cauda equina: Conus extends to the T12-L1
level. Conus and cauda equina appear normal.

Paraspinal and other soft tissues: Negative for paraspinous mass or
adenopathy

Disc levels:

L1-2: Negative

L2-3: Mild disc degeneration with disc bulging and endplate
spurring. Mild to moderate subarticular and foraminal narrowing
bilaterally.

L3-4: Disc degeneration and disc bulging with mild facet
degeneration. No significant stenosis

L4-5: 4 mm anterolisthesis with moderate to advanced facet
degenerative. Small central disc protrusion. Mild subarticular
stenosis on the left.

L5-S1: Moderately large left-sided disc protrusion with likely
extruded disc fragment. There is compression of left S1 nerve root
bilateral facet degeneration.
IMPRESSION: Disc and facet degeneration L2-3 with mild to moderate subarticular
and foraminal narrowing bilaterally.

Grade 1 anterolisthesis L4-5. Mild subarticular stenosis on the
left.

Moderately large extruded disc fragment on the left L5-S1 with left
S1 nerve root impingement

## 2022-11-26 IMAGING — XA Imaging study
2 series · 2 of 2 positions shown · non-contrast
Comparison: none

CLINICAL DATA: 80-year-old male with lumbosacral spondylosis
without myelopathy. Low back pain with left sided sciatica. MRI
lumbar spine demonstrates a moderately large left-sided disc
protrusion at L5-S1 resulting in compression of the left S1 nerve
root.

[Series 1: ortho standard · 1 of 1 slices shown (1 of 2)]
[im 1/1]
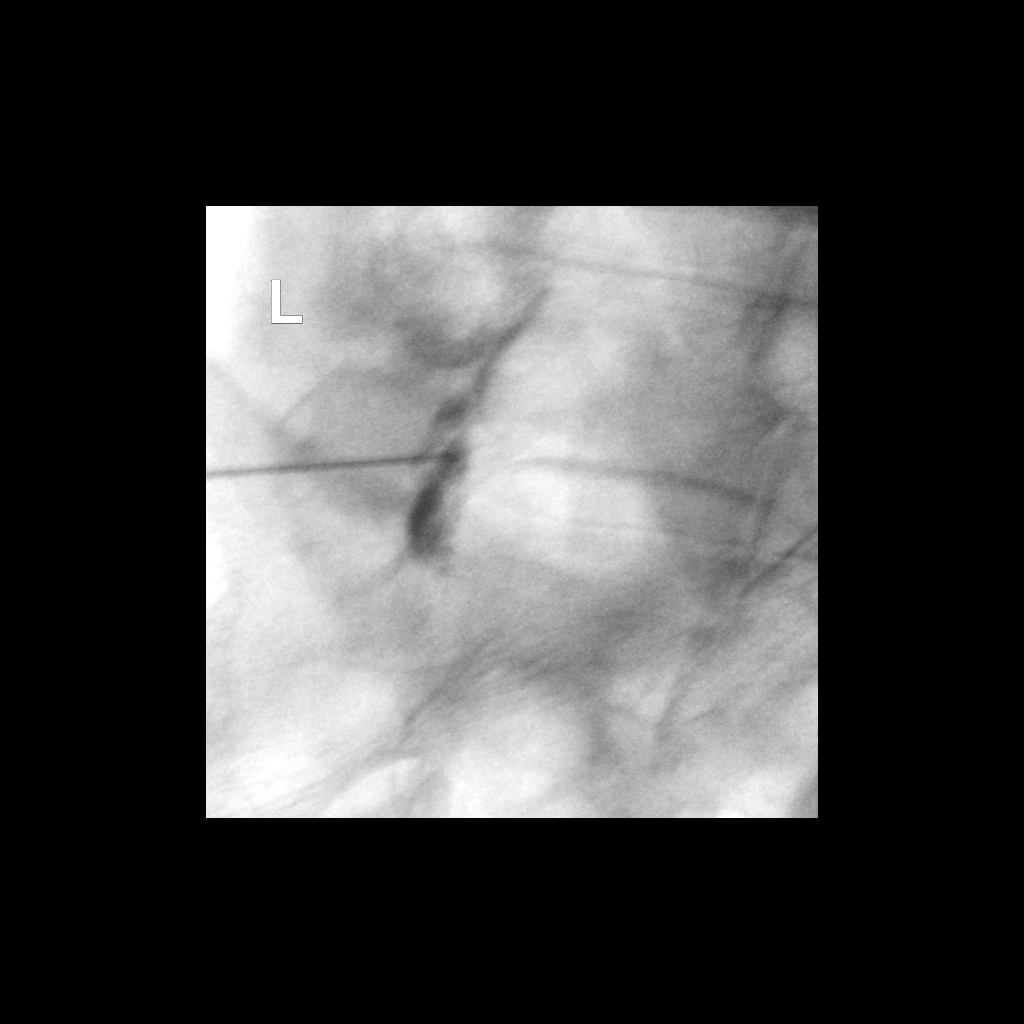

[Series 2: ortho standard · 1 of 1 slices shown (2 of 2)]
[im 1/1]
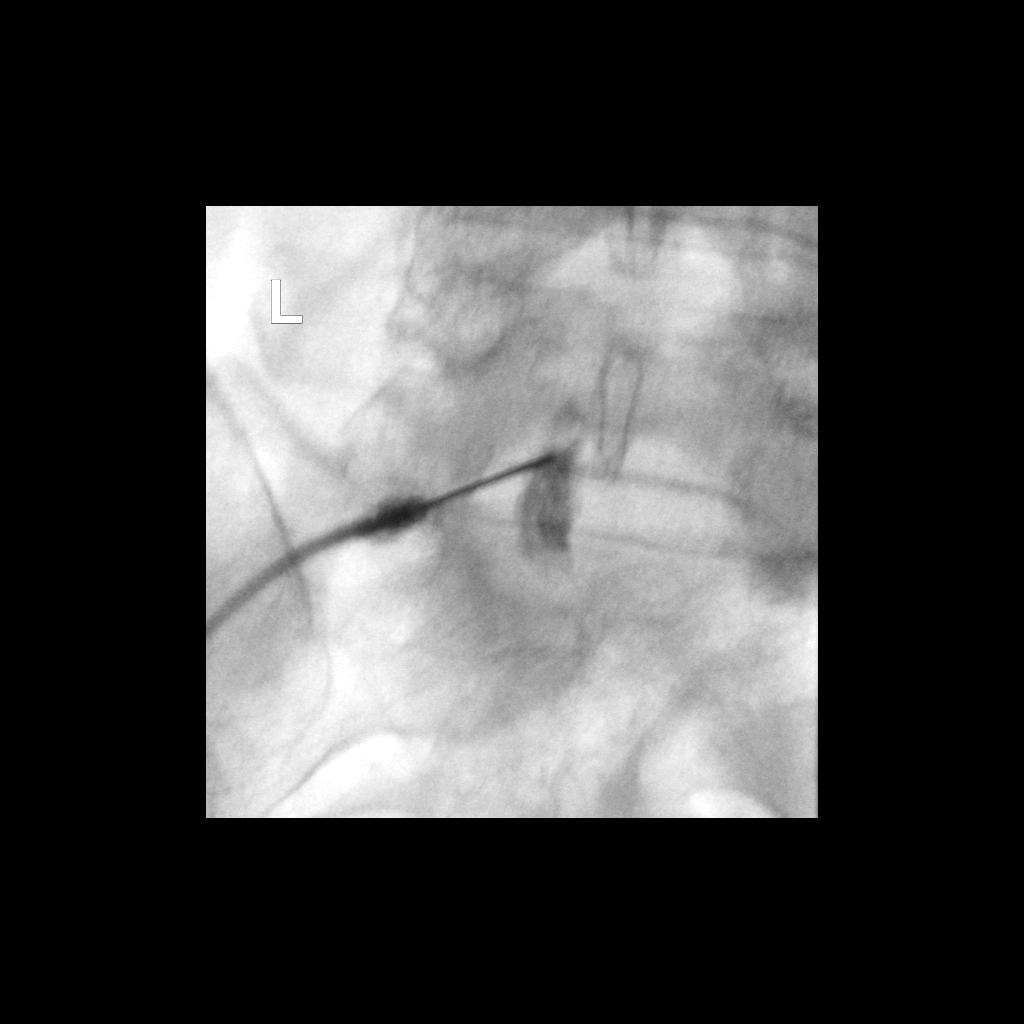

[2 of 2 positions shown; findings below may reference images not displayed]

FLUOROSCOPY TIME:  Dictate in minutes and seconds

PROCEDURE:
The procedure, risks, benefits, and alternatives were explained to
the patient. Questions regarding the procedure were encouraged and
answered. The patient understands and consents to the procedure.

LUMBAR EPIDURAL INJECTION:

An interlaminar approach was performed on left at L5-S1. The
overlying skin was cleansed and anesthetized. A 20 gauge epidural
needle was advanced using loss-of-resistance technique.

DIAGNOSTIC EPIDURAL INJECTION:

Injection of Isovue-M 200 shows a good epidural pattern with spread
above and below the level of needle placement, primarily on the left
no vascular opacification is seen.

THERAPEUTIC EPIDURAL INJECTION:

40 mg of Depo-Medrol mixed with 2 mL 1% lidocaine were instilled.
The procedure was well-tolerated, and the patient was discharged
thirty minutes following the injection in good condition.

COMPLICATIONS:
None immediate.
IMPRESSION: Technically successful epidural injection on the left L5-S1.

## 2023-01-10 DIAGNOSIS — L728 Other follicular cysts of the skin and subcutaneous tissue: Secondary | ICD-10-CM | POA: Diagnosis not present

## 2023-03-22 DIAGNOSIS — D225 Melanocytic nevi of trunk: Secondary | ICD-10-CM | POA: Diagnosis not present

## 2023-03-22 DIAGNOSIS — L821 Other seborrheic keratosis: Secondary | ICD-10-CM | POA: Diagnosis not present

## 2023-03-22 DIAGNOSIS — L814 Other melanin hyperpigmentation: Secondary | ICD-10-CM | POA: Diagnosis not present

## 2023-06-07 DIAGNOSIS — Z125 Encounter for screening for malignant neoplasm of prostate: Secondary | ICD-10-CM | POA: Diagnosis not present

## 2023-06-07 DIAGNOSIS — R7989 Other specified abnormal findings of blood chemistry: Secondary | ICD-10-CM | POA: Diagnosis not present

## 2023-06-07 DIAGNOSIS — Z79899 Other long term (current) drug therapy: Secondary | ICD-10-CM | POA: Diagnosis not present

## 2023-06-21 DIAGNOSIS — Z1331 Encounter for screening for depression: Secondary | ICD-10-CM | POA: Diagnosis not present

## 2023-06-21 DIAGNOSIS — Z1339 Encounter for screening examination for other mental health and behavioral disorders: Secondary | ICD-10-CM | POA: Diagnosis not present

## 2023-06-21 DIAGNOSIS — Z0000 Encounter for general adult medical examination without abnormal findings: Secondary | ICD-10-CM | POA: Diagnosis not present

## 2023-06-21 DIAGNOSIS — R82998 Other abnormal findings in urine: Secondary | ICD-10-CM | POA: Diagnosis not present

## 2023-06-21 DIAGNOSIS — Z23 Encounter for immunization: Secondary | ICD-10-CM | POA: Diagnosis not present

## 2023-11-21 DIAGNOSIS — H2513 Age-related nuclear cataract, bilateral: Secondary | ICD-10-CM | POA: Diagnosis not present

## 2023-11-21 DIAGNOSIS — H5203 Hypermetropia, bilateral: Secondary | ICD-10-CM | POA: Diagnosis not present

## 2024-03-28 ENCOUNTER — Ambulatory Visit
Admission: RE | Admit: 2024-03-28 | Discharge: 2024-03-28 | Disposition: A | Discharge status: Home / Self Care | Source: Ambulatory Visit

## 2024-03-28 ENCOUNTER — Ambulatory Visit

## 2024-03-28 VITALS — BP 124/80 | Ht 69.0 in | Wt 142.0 lb

## 2024-03-28 DIAGNOSIS — M1711 Unilateral primary osteoarthritis, right knee: Secondary | ICD-10-CM

## 2024-03-28 DIAGNOSIS — M25561 Pain in right knee: Secondary | ICD-10-CM

## 2024-03-28 NOTE — Progress Notes (Cosign Needed)
 [facility] Department of [facility]   PCP: Shepard Ade, MD  CHIEF COMPLAINT: Right knee pain  HPI: Patient is a pleasant 84 y.o. male who presents today for evaluation of right knee pain.  Patient states he was playing pickle ball 2 weeks ago when he mildly tweaked his right knee reaching for a ball.  Did not hurt much immediately after injury but over the next couple days he developed some soreness and throbbing.  He took a break from pickleball for a week but still having some persistent pain.  Other activities that aggravate symptoms include walking down declined slope.  Patient states he knows he has some knee arthritis but has never had any x-rays or other management for symptoms.  Does not take any oral medications.  Tried icing some and this improves some of his symptoms.   PMH:  Past Medical History:  Diagnosis Date   BPH (benign prostatic hyperplasia)    Colon polyps    adenomatous   Iron deficiency anemia     Patient Active Problem List   Diagnosis Date Noted   Strain of gluteus medius of left lower extremity 08/17/2020   ANEMIA, IRON DEFICIENCY 03/30/2009   ANGIODYSPLASIA-INTESTINE 03/30/2009   History of colonic polyps 03/30/2009    PSurg:  Past Surgical History:  Procedure Laterality Date   COLONOSCOPY     PROSTATE BIOPSY      Allergies: Sulfonamide derivatives  Meds:  Previous Medications   DICLOFENAC  (VOLTAREN ) 75 MG EC TABLET    Take 1 tablet (75 mg total) by mouth 2 (two) times daily.   HYDROCODONE -ACETAMINOPHEN  (NORCO) 5-325 MG TABLET    Take 1 tablet by mouth every 6 (six) hours as needed for moderate pain.   MULTIPLE VITAMINS-MINERALS (MULTIVITAMIN PO)    Take by mouth.   PREDNISONE  (DELTASONE ) 10 MG TABLET    6 tabs po day 1, 5 tabs po day 2, 4 tabs po day 3, 3 tabs po day 4, 2 tabs po day 5, 1 tab po day 6   SILDENAFIL (VIAGRA) 100 MG TABLET    Take 100 mg by mouth as needed.    Social:   Social History   Tobacco Use   Smoking status: Former    Current packs/day: 0.00    Average packs/day: 1 pack/day for 25.0 years (25.0 ttl pk-yrs)    Types: Cigarettes    Start date: 03/07/1957    Quit date: 03/07/1982    Years since quitting: 42.0   Smokeless tobacco: Never  Substance Use Topics   Alcohol  use: Yes    Comment: 1-2 glasses of wine per day    REVIEW OF SYSTEMS:  ROS negative except as noted in HPI above   Objective Exam:  Vitals:   03/28/24 1031  BP: 124/80  Weight: 142 lb (64.4 kg)  Height: 5' 9 (1.753 m)    GENERAL: Patient is afebrile, Vital signs reviewed, well appearing, Patient appears comfortable, Alert and lucid. No apparent distress.   Physical Exam   Ortho Exam:  On examination of patient's knee no signs of erythema, ecchymoses.  Mild right knee effusion present.  Denies any significant tenderness to palpation over joint line or bony landmarks.  Patient has full active and passive range of motion.  Mild flexion contracture at full extension.  Strength 5/5 in all planes of motion.  Patient neurovascularly intact distally.  Negative anterior/posterior drawer test.  Negative Lachman's.  Negative varus valgus stress testing.  Negative McMurray's test.  Flexion extension does produce audible crepitus in his right patellofemoral space.   RESULTS:  Labs: No results found for this or any previous visit (from the past 48 hours).  Imaging:  DG Knee AP/LAT W/Sunrise Right    (Results Pending)    Assessment/Plan:  1. Acute pain of right knee   2. Primary osteoarthritis of right knee    -Provided patient with home exercise program focus on quad/VMO and hamstring strengthening  -Recommend applying Voltaren  gel over painful area for knee up to 3 times a day for next 2 weeks.  As needed thereafter.  We did discuss potential of right knee cortisone injection but patient declines this treatment option at today's appointment.  May consider if pain still  persisting after 4 weeks of conservative management. -Patient can buy compression knee versus for additional comfort.   -Educated on expected recovery timeline, especially how this is not necessarily a quick fix given persistent right knee osteoarthritis especially in the patellofemoral compartment.  Patient and family verbalized understanding. -Will also send patient to get right knee x-ray series at [facility] at today's visit -Will return in 4 weeks for reevaluation -Patient understands treatment plan with no further questions or concerns at this time.   New Prescriptions   No medications on file    Medications, medical history, allergies, surgical history, hospitalizations, family history, social history, ROS and vitals entered by nursing staff and reviewed by myself.  I discussed with the patient the diagnosis, treatment plan, indications for return to the emergency department, and for expected follow-up. The patient verbalized an understanding. The patient is asked if there are any questions or concerns. We discuss the case, until all issues are addressed to the patient's satisfaction.  Follow up per instructions including returning for additional office visit if symptoms worsen or proceeding to the emergency department or urgent care in the next 12-24hrs if there is an acute concerning increasing symptoms, pain, fevers, or other symptoms.  Prentice Agent, DO  11:15 AM, 03/28/2024

## 2024-03-28 NOTE — Patient Instructions (Signed)
 Voltaren gel

## 2024-04-25 ENCOUNTER — Ambulatory Visit
